# Patient Record
Sex: Male | Born: 1950 | Race: White | Hispanic: No | Marital: Married | Smoking: Never smoker
Health system: Southern US, Community
[De-identification: ages and names within clinical notes are randomized; demographics above are authoritative.]

## PROBLEM LIST (undated history)

## (undated) HISTORY — PX: BRAIN SURGERY: SHX531

---

## 2008-02-22 ENCOUNTER — Encounter (INDEPENDENT_AMBULATORY_CARE_PROVIDER_SITE_OTHER): Payer: Self-pay | Admitting: General Surgery

## 2008-02-22 ENCOUNTER — Ambulatory Visit (HOSPITAL_BASED_OUTPATIENT_CLINIC_OR_DEPARTMENT_OTHER): Admission: RE | Admit: 2008-02-22 | Discharge: 2008-02-22 | Payer: Self-pay | Admitting: General Surgery

## 2010-11-27 NOTE — Op Note (Signed)
Albert Moreno, Albert Moreno            ACCOUNT NO.:  [id number]   MEDICAL RECORD NO.:  [id number]          PATIENT TYPE:  AMB   LOCATION:  DSC                          FACILITY:  [facility]   PHYSICIAN:  Juanetta Gosling, MDDATE OF BIRTH:  [birth year]   DATE OF PROCEDURE:  02/22/2008  DATE OF DISCHARGE:                               OPERATIVE REPORT   PREOPERATIVE DIAGNOSIS:  Multiple masses, left arm, right arm, right  thigh, and abdominal wall.   POSTOPERATIVE DIAGNOSIS:  Multiple lipomas.   PROCEDURES:  Excisional biopsy of 2 right arm, 1 left arm, 1 abdominal  wall, and 1 right thigh mass.   SURGEON:  Troy Sine. Dwain Sarna, MD.   ASSISTANT:  None.   ANESTHESIA:  Local with monitored anesthesia care.   SPECIMENS:  All masses to pathology.   ESTIMATED BLOOD LOSS:  Minimal.   COMPLICATIONS:  None.   DRAINS:  None.   DISPOSITION:  PACU in stable condition.   INDICATION:  Albert Moreno is a 60 year old male who presents with a  several-year history of multiple lumps, which have been causing pain  especially while working.  These have been slowly increasing in size  over this time.  On exam, these appear to be consistent with lipomas and  I discussed excisional biopsy of all of these areas for him.   PROCEDURE IN DETAIL:  After informed consent was obtained, the patient  was taken to the operating room.  He was placed on a monitored  anesthesia care.  His left arm, right arm, abdominal wall, and thigh  were then prepped and draped with towels in the standard sterile  surgical fashion.  Lidocaine 1% with epinephrine was mixed 50:50 with  0.25% Marcaine without epinephrine.  This was infiltrated into all of  these areas.  The medial left arm mass was approached first.  A 1-cm  incision was made overlying the mass, which was then delivered into the  wound easily and removed with electrocautery.  This wound was closed  with 3-0 Vicryl deep dermal stitch and a 4-0 Monocryl in a  subcuticular  fashion.  The right arm mass was then approached.  A 1.5-cm incision was  made overlying this mass.  It was identified easily and delivered into  the wound and this mass had a blood vessel attached to it.  This was  ligated with a 3-0 Vicryl tie.  This wound was then again closed with 4-  0 Vicryl in the deep dermal suture and 4-0 Monocryl in a subcuticular  fashion.  The abdominal wall mass was then identified and 1.5-cm mass  delivered through the skin and excised using electrocautery.  A 4-0  Vicryl was used to close the dermis, 4-0 Monocryl was used to close the  skin in subcuticular fashion.  The right thigh mass was then approached.  A vertical 2-cm incision was made overlying the mass, which was then  delivered into the wound easily with no attachments and removed with  electrocautery.  This wound was also closed with 4-0 Vicryl deep dermal  suture and a 4-0 Monocryl in a subcuticular fashion.  The  other lateral  left arm mass was then approached with a 1.5-cm incision.  The lipoma  was then delivered into incision and removed with electrocautery.  This  was again closed with a 4-0 Vicryl deep dermal and 4-0 Monocryl  subcuticular.  Sterile dressings were placed on all of these after  placing Steri-Strips.  He tolerated this well under exclusively local  anesthesia and was transferred to PACU in stable condition.      Juanetta Gosling, MD  Electronically Signed     MCW/MEDQ  D:  02/22/2008  T:  02/23/2008  Job:  [id number]

## 2011-04-12 LAB — DIFFERENTIAL
Basophils Absolute: 0
Basophils Relative: 1
Eosinophils Absolute: 0.2
Eosinophils Relative: 3
Monocytes Absolute: 0.4
Neutro Abs: 3.5

## 2011-04-12 LAB — BASIC METABOLIC PANEL
CO2: 26
Calcium: 9.3
Chloride: 106
Glucose, Bld: 150 — ABNORMAL HIGH
Sodium: 139

## 2011-04-12 LAB — CBC
HCT: 39.5
Hemoglobin: 13.6
MCHC: 34.4
MCV: 88.9
RDW: 12.7

## 2013-02-15 ENCOUNTER — Other Ambulatory Visit: Payer: Self-pay | Admitting: Gastroenterology

## 2014-05-18 ENCOUNTER — Other Ambulatory Visit: Payer: Self-pay | Admitting: Dermatology

## 2017-01-08 ENCOUNTER — Other Ambulatory Visit: Payer: Self-pay | Admitting: Family Medicine

## 2017-01-08 DIAGNOSIS — Z79899 Other long term (current) drug therapy: Secondary | ICD-10-CM | POA: Diagnosis not present

## 2017-01-08 DIAGNOSIS — Z87891 Personal history of nicotine dependence: Secondary | ICD-10-CM

## 2017-01-08 DIAGNOSIS — E7800 Pure hypercholesterolemia, unspecified: Secondary | ICD-10-CM | POA: Diagnosis not present

## 2017-01-08 DIAGNOSIS — H9202 Otalgia, left ear: Secondary | ICD-10-CM | POA: Diagnosis not present

## 2017-01-08 DIAGNOSIS — Z0000 Encounter for general adult medical examination without abnormal findings: Secondary | ICD-10-CM | POA: Diagnosis not present

## 2017-01-24 ENCOUNTER — Ambulatory Visit
Admission: RE | Admit: 2017-01-24 | Discharge: 2017-01-24 | Disposition: A | Discharge status: Home / Self Care | Payer: Medicare Other | Source: Ambulatory Visit | Attending: Family Medicine | Admitting: Family Medicine

## 2017-01-24 DIAGNOSIS — Z87891 Personal history of nicotine dependence: Secondary | ICD-10-CM | POA: Diagnosis not present

## 2017-01-24 DIAGNOSIS — Z136 Encounter for screening for cardiovascular disorders: Secondary | ICD-10-CM | POA: Diagnosis not present

## 2017-02-14 DIAGNOSIS — H9202 Otalgia, left ear: Secondary | ICD-10-CM | POA: Diagnosis not present

## 2017-02-14 DIAGNOSIS — H9193 Unspecified hearing loss, bilateral: Secondary | ICD-10-CM | POA: Diagnosis not present

## 2017-04-04 ENCOUNTER — Other Ambulatory Visit: Payer: Self-pay | Admitting: Otolaryngology

## 2017-04-04 DIAGNOSIS — R5381 Other malaise: Secondary | ICD-10-CM

## 2017-04-21 ENCOUNTER — Other Ambulatory Visit: Payer: Self-pay | Admitting: Otolaryngology

## 2017-04-21 DIAGNOSIS — R51 Headache: Principal | ICD-10-CM

## 2017-04-21 DIAGNOSIS — R519 Headache, unspecified: Secondary | ICD-10-CM

## 2017-04-21 DIAGNOSIS — G8929 Other chronic pain: Secondary | ICD-10-CM

## 2017-05-02 ENCOUNTER — Ambulatory Visit
Admission: RE | Admit: 2017-05-02 | Discharge: 2017-05-02 | Disposition: A | Discharge status: Home / Self Care | Payer: Medicare Other | Source: Ambulatory Visit | Attending: Otolaryngology | Admitting: Otolaryngology

## 2017-05-02 ENCOUNTER — Encounter: Payer: Self-pay | Admitting: Otolaryngology

## 2017-05-02 DIAGNOSIS — R5381 Other malaise: Secondary | ICD-10-CM

## 2017-05-02 DIAGNOSIS — M542 Cervicalgia: Secondary | ICD-10-CM | POA: Diagnosis not present

## 2017-05-02 DIAGNOSIS — R51 Headache: Secondary | ICD-10-CM | POA: Diagnosis not present

## 2017-05-02 DIAGNOSIS — G8929 Other chronic pain: Secondary | ICD-10-CM

## 2017-05-02 MED ORDER — IOPAMIDOL (ISOVUE-300) INJECTION 61%: 75.0000 mL | Freq: Once | INTRAVENOUS | Status: DC | PRN

## 2017-05-06 DIAGNOSIS — D329 Benign neoplasm of meninges, unspecified: Secondary | ICD-10-CM | POA: Diagnosis not present

## 2017-05-07 ENCOUNTER — Other Ambulatory Visit: Payer: Self-pay | Admitting: Neurosurgery

## 2017-05-07 DIAGNOSIS — D329 Benign neoplasm of meninges, unspecified: Secondary | ICD-10-CM

## 2017-05-14 ENCOUNTER — Ambulatory Visit
Admission: RE | Admit: 2017-05-14 | Discharge: 2017-05-14 | Disposition: A | Discharge status: Home / Self Care | Payer: Medicare Other | Source: Ambulatory Visit | Attending: Neurosurgery | Admitting: Neurosurgery

## 2017-05-14 DIAGNOSIS — D329 Benign neoplasm of meninges, unspecified: Secondary | ICD-10-CM

## 2017-05-14 DIAGNOSIS — D320 Benign neoplasm of cerebral meninges: Secondary | ICD-10-CM | POA: Diagnosis not present

## 2017-05-14 MED ORDER — GADOBENATE DIMEGLUMINE 529 MG/ML IV SOLN
17.0000 mL | Freq: Once | INTRAVENOUS | Status: AC | PRN
  Administered 2017-05-14: 17 mL via INTRAVENOUS

## 2017-05-19 ENCOUNTER — Other Ambulatory Visit: Payer: Medicare Other

## 2017-05-20 DIAGNOSIS — R030 Elevated blood-pressure reading, without diagnosis of hypertension: Secondary | ICD-10-CM | POA: Diagnosis not present

## 2017-05-20 DIAGNOSIS — D329 Benign neoplasm of meninges, unspecified: Secondary | ICD-10-CM | POA: Diagnosis not present

## 2017-05-20 DIAGNOSIS — Z6827 Body mass index (BMI) 27.0-27.9, adult: Secondary | ICD-10-CM | POA: Diagnosis not present

## 2017-05-27 DIAGNOSIS — D329 Benign neoplasm of meninges, unspecified: Secondary | ICD-10-CM | POA: Diagnosis not present

## 2017-05-27 DIAGNOSIS — R399 Unspecified symptoms and signs involving the genitourinary system: Secondary | ICD-10-CM | POA: Diagnosis not present

## 2017-05-27 DIAGNOSIS — R791 Abnormal coagulation profile: Secondary | ICD-10-CM | POA: Diagnosis not present

## 2017-06-02 DIAGNOSIS — D329 Benign neoplasm of meninges, unspecified: Secondary | ICD-10-CM | POA: Diagnosis not present

## 2017-06-02 DIAGNOSIS — D496 Neoplasm of unspecified behavior of brain: Secondary | ICD-10-CM | POA: Diagnosis not present

## 2017-06-02 DIAGNOSIS — Z01818 Encounter for other preprocedural examination: Secondary | ICD-10-CM | POA: Diagnosis not present

## 2017-06-02 DIAGNOSIS — E785 Hyperlipidemia, unspecified: Secondary | ICD-10-CM | POA: Diagnosis present

## 2017-06-02 DIAGNOSIS — D320 Benign neoplasm of cerebral meninges: Secondary | ICD-10-CM | POA: Diagnosis present

## 2017-06-05 DIAGNOSIS — D320 Benign neoplasm of cerebral meninges: Secondary | ICD-10-CM | POA: Diagnosis present

## 2017-06-05 DIAGNOSIS — E785 Hyperlipidemia, unspecified: Secondary | ICD-10-CM | POA: Diagnosis present

## 2017-06-05 DIAGNOSIS — D496 Neoplasm of unspecified behavior of brain: Secondary | ICD-10-CM | POA: Diagnosis not present

## 2017-06-05 DIAGNOSIS — Z9889 Other specified postprocedural states: Secondary | ICD-10-CM | POA: Diagnosis not present

## 2017-06-05 DIAGNOSIS — R233 Spontaneous ecchymoses: Secondary | ICD-10-CM | POA: Diagnosis not present

## 2017-06-05 DIAGNOSIS — D329 Benign neoplasm of meninges, unspecified: Secondary | ICD-10-CM | POA: Diagnosis not present

## 2017-06-05 DIAGNOSIS — R569 Unspecified convulsions: Secondary | ICD-10-CM | POA: Diagnosis not present

## 2017-07-11 ENCOUNTER — Encounter (HOSPITAL_COMMUNITY): Payer: Self-pay | Admitting: Emergency Medicine

## 2017-07-11 ENCOUNTER — Other Ambulatory Visit: Payer: Self-pay

## 2017-07-11 ENCOUNTER — Inpatient Hospital Stay (HOSPITAL_COMMUNITY)
Admission: EM | Admit: 2017-07-11 | Discharge: 2017-07-14 | DRG: 176 | Disposition: A | Discharge status: Home / Self Care | Payer: Medicare Other | Attending: Internal Medicine | Admitting: Internal Medicine

## 2017-07-11 ENCOUNTER — Emergency Department (HOSPITAL_COMMUNITY): Payer: Medicare Other

## 2017-07-11 DIAGNOSIS — I82413 Acute embolism and thrombosis of femoral vein, bilateral: Secondary | ICD-10-CM | POA: Diagnosis present

## 2017-07-11 DIAGNOSIS — Z9889 Other specified postprocedural states: Secondary | ICD-10-CM

## 2017-07-11 DIAGNOSIS — D329 Benign neoplasm of meninges, unspecified: Secondary | ICD-10-CM

## 2017-07-11 DIAGNOSIS — D649 Anemia, unspecified: Secondary | ICD-10-CM | POA: Diagnosis present

## 2017-07-11 DIAGNOSIS — I82433 Acute embolism and thrombosis of popliteal vein, bilateral: Secondary | ICD-10-CM | POA: Diagnosis present

## 2017-07-11 DIAGNOSIS — R079 Chest pain, unspecified: Secondary | ICD-10-CM | POA: Diagnosis not present

## 2017-07-11 DIAGNOSIS — Z86011 Personal history of benign neoplasm of the brain: Secondary | ICD-10-CM

## 2017-07-11 DIAGNOSIS — R000 Tachycardia, unspecified: Secondary | ICD-10-CM | POA: Diagnosis present

## 2017-07-11 DIAGNOSIS — E785 Hyperlipidemia, unspecified: Secondary | ICD-10-CM

## 2017-07-11 DIAGNOSIS — I2609 Other pulmonary embolism with acute cor pulmonale: Secondary | ICD-10-CM | POA: Diagnosis not present

## 2017-07-11 DIAGNOSIS — R55 Syncope and collapse: Secondary | ICD-10-CM | POA: Diagnosis not present

## 2017-07-11 DIAGNOSIS — I248 Other forms of acute ischemic heart disease: Secondary | ICD-10-CM | POA: Diagnosis present

## 2017-07-11 DIAGNOSIS — I2692 Saddle embolus of pulmonary artery without acute cor pulmonale: Secondary | ICD-10-CM | POA: Diagnosis not present

## 2017-07-11 DIAGNOSIS — I2699 Other pulmonary embolism without acute cor pulmonale: Secondary | ICD-10-CM | POA: Diagnosis not present

## 2017-07-11 DIAGNOSIS — Z79899 Other long term (current) drug therapy: Secondary | ICD-10-CM

## 2017-07-11 LAB — CBC WITH DIFFERENTIAL/PLATELET
BASOS ABS: 0 10*3/uL (ref 0.0–0.1)
BASOS PCT: 1 %
EOS PCT: 2 %
Eosinophils Absolute: 0.1 10*3/uL (ref 0.0–0.7)
HEMATOCRIT: 33.6 % — AB (ref 39.0–52.0)
Hemoglobin: 11 g/dL — ABNORMAL LOW (ref 13.0–17.0)
LYMPHS PCT: 14 %
Lymphs Abs: 1.2 10*3/uL (ref 0.7–4.0)
MCH: 27.9 pg (ref 26.0–34.0)
MCHC: 32.7 g/dL (ref 30.0–36.0)
MCV: 85.3 fL (ref 78.0–100.0)
MONO ABS: 0.5 10*3/uL (ref 0.1–1.0)
MONOS PCT: 6 %
Neutro Abs: 6.6 10*3/uL (ref 1.7–7.7)
Neutrophils Relative %: 77 %
PLATELETS: 271 10*3/uL (ref 150–400)
RBC: 3.94 MIL/uL — ABNORMAL LOW (ref 4.22–5.81)
RDW: 14.1 % (ref 11.5–15.5)
WBC: 8.5 10*3/uL (ref 4.0–10.5)

## 2017-07-11 LAB — COMPREHENSIVE METABOLIC PANEL
ALT: 29 U/L (ref 17–63)
ANION GAP: 11 (ref 5–15)
AST: 30 U/L (ref 15–41)
Albumin: 3.5 g/dL (ref 3.5–5.0)
Alkaline Phosphatase: 86 U/L (ref 38–126)
BUN: 11 mg/dL (ref 6–20)
CHLORIDE: 105 mmol/L (ref 101–111)
CO2: 23 mmol/L (ref 22–32)
Calcium: 9.2 mg/dL (ref 8.9–10.3)
Creatinine, Ser: 0.87 mg/dL (ref 0.61–1.24)
GFR calc non Af Amer: >60 mL/min (ref >60)
Glucose, Bld: 120 mg/dL — ABNORMAL HIGH (ref 65–99)
POTASSIUM: 3.8 mmol/L (ref 3.5–5.1)
Sodium: 139 mmol/L (ref 135–145)
TOTAL PROTEIN: 6.4 g/dL — AB (ref 6.5–8.1)
Total Bilirubin: 0.9 mg/dL (ref 0.3–1.2)

## 2017-07-11 LAB — URINALYSIS, ROUTINE W REFLEX MICROSCOPIC
Bilirubin Urine: NEGATIVE
Glucose, UA: NEGATIVE mg/dL
HGB URINE DIPSTICK: NEGATIVE
Ketones, ur: 5 mg/dL — AB
LEUKOCYTES UA: NEGATIVE
NITRITE: NEGATIVE
Protein, ur: NEGATIVE mg/dL
pH: 7 (ref 5.0–8.0)

## 2017-07-11 LAB — HEPARIN LEVEL (UNFRACTIONATED)
Heparin Unfractionated: 0.65 IU/mL (ref 0.30–0.70)
Heparin Unfractionated: 0.43 IU/mL (ref 0.30–0.70)

## 2017-07-11 LAB — TROPONIN I
TROPONIN I: 0.33 ng/mL — AB (ref <0.03)
Troponin I: 0.66 ng/mL (ref <0.03)
Troponin I: 0.75 ng/mL (ref <0.03)

## 2017-07-11 MED ORDER — SODIUM CHLORIDE 0.9 % IV BOLUS (SEPSIS)
1000.0000 mL | Freq: Once | INTRAVENOUS | Status: AC
  Administered 2017-07-11: 1000 mL via INTRAVENOUS

## 2017-07-11 MED ORDER — HEPARIN BOLUS VIA INFUSION
4000.0000 [IU] | Freq: Once | INTRAVENOUS | Status: AC
  Administered 2017-07-11: 4000 [IU] via INTRAVENOUS
  Filled 2017-07-11: qty 4000

## 2017-07-11 MED ORDER — NOREPINEPHRINE BITARTRATE 1 MG/ML IV SOLN
50.0000 ug/min | INTRAVENOUS | Status: DC
  Filled 2017-07-11: qty 4

## 2017-07-11 MED ORDER — ACETAMINOPHEN 325 MG PO TABS: 650.0000 mg | ORAL_TABLET | Freq: Four times a day (QID) | ORAL | Status: DC | PRN

## 2017-07-11 MED ORDER — HEPARIN (PORCINE) IN NACL 100-0.45 UNIT/ML-% IJ SOLN
1400.0000 [IU]/h | INTRAMUSCULAR | Status: DC
  Administered 2017-07-11 – 2017-07-13 (×3): 1300 [IU]/h via INTRAVENOUS
  Filled 2017-07-11 (×4): qty 250

## 2017-07-11 MED ORDER — METOPROLOL TARTRATE 5 MG/5ML IV SOLN: 2.5000 mg | INTRAVENOUS | Status: DC | PRN

## 2017-07-11 MED ORDER — ASPIRIN 81 MG PO CHEW
324.0000 mg | CHEWABLE_TABLET | Freq: Once | ORAL | Status: AC
  Administered 2017-07-11: 324 mg via ORAL
  Filled 2017-07-11: qty 4

## 2017-07-11 MED ORDER — IOPAMIDOL (ISOVUE-370) INJECTION 76%
INTRAVENOUS | Status: AC
  Administered 2017-07-11: 100 mL
  Filled 2017-07-11: qty 100

## 2017-07-11 NOTE — Progress Notes (Signed)
CRITICAL VALUE STICKER  CRITICAL VALUE: Troponin 0.75  RECEIVER (on-site recipient of call):   DATE & TIME NOTIFIED: Resulted 07/11/17 - 23:40  MESSENGER (representative from lab): No call received  MD NOTIFIED: X. Blount  TIME OF NOTIFICATION: 07/11/17 23:45  RESPONSE: Text Paged.

## 2017-07-11 NOTE — ED Triage Notes (Signed)
Pt here from home with c/o sob and near syncopal after walking top his mailbox , pt did not hit his head , no chest pain , pt is 5 weeks post op brain surgery

## 2017-07-11 NOTE — Progress Notes (Signed)
Patient arrived to nursing unit alert and oriented x 4, No distress. Patient place on bedside and continuous pulse ox. Patient oriented to the room and unit protocol. Wife at bedside and updated per patient request.

## 2017-07-11 NOTE — ED Notes (Signed)
Pt given cup of ice water to drink  

## 2017-07-11 NOTE — Progress Notes (Signed)
ANTICOAGULATION CONSULT NOTE  Pharmacy Consult for Heparin  Indication: pulmonary embolus  No Known Allergies  Patient Measurements: Height: 5\' 9"  (175.3 cm) Weight: 176 lb 9.4 oz (80.1 kg) IBW/kg (Calculated) : 70.7 Heparin Dosing Weight: 79 kg  Vital Signs: Temp: 98.1 F (36.7 C) (12/28 1803) Temp Source: Oral (12/28 1803) BP: 118/88 (12/28 1742) Pulse Rate: 104 (12/28 1742)  Labs: Recent Labs    07/11/17 1033 07/11/17 1511 07/11/17 1953  HGB 11.0*  --   --   HCT 33.6*  --   --   PLT 271  --   --   HEPARINUNFRC  --  0.65 0.43  CREATININE 0.87  --   --   TROPONINI 0.33* 0.66*  --     Estimated Creatinine Clearance: 84.7 mL/min (by C-G formula based on SCr of 0.87 mg/dL).   Medical History: History reviewed. No pertinent past medical history.  Medications:  Medications Prior to Admission  Medication Sig Dispense Refill Last Dose  . acetaminophen (TYLENOL) 500 MG tablet Take 1,000 mg by mouth every 6 (six) hours as needed for mild pain or headache.   07/07/2017  . atorvastatin (LIPITOR) 10 MG tablet Take 10 mg by mouth daily.  0 07/11/2017 at Unknown time  . Multiple Vitamin (MULTI-VITAMINS) TABS Take 1 tablet by mouth daily.   07/10/2017 at Unknown time    Assessment: 40 YOM here with acute onset of dyspnea found to have a PE on CT. Pharmacy consulted to start IV heparin. Of note, patient is s/p a meningioma resection ~ 5 weeks ago. H/H 11/33.6. Plt wnl. He is not on any anticoagulation prior to admission  Initial heparin level was drawn too early, repeat is still therapeutic, but trending down.  Goal of Therapy:  Heparin level 0.3-0.7 units/ml Monitor platelets by anticoagulation protocol: Yes   Plan:  -Continue heparin at 1300 units/hr -Daily HL, CBC   Harvel Quale  07/11/2017 8:34 PM

## 2017-07-11 NOTE — ED Provider Notes (Signed)
Emergency Department Provider Note   I have reviewed the triage vital signs and the nursing notes.   HISTORY  Chief Complaint Near Syncope   HPI Albert Moreno is a 66 y.o. male without significant past medical history aside from a recent meningioma resection and due to presents the emergency department today with dyspnea on exertion and dizziness.  Patient states his normal state of health and return to the gym and has been doing okay without any problems but then today he was walking to the mailbox this morning when she does often and had acute onset of significant dyspnea with a mild left chest achiness and lightheadedness.  He was able to sit down take some deep breaths and symptoms resolved.  He proceeded to drive to the store to get coffee and he came back when he walked into the house he gets fairly dyspneic again and felt like he was going to pass out he sat down on the floor because he cannot go any further and his wife called ambulance who brought him here for further evaluation.  Rhythm strip with paramedics was normal without any evidence of ST elevation.  He has no history of smoking, hypertension, hyperlipidemia or coronary artery disease in his family at a young age.  At this time he is asymptomatic at rest.  No other associated modifying symptoms.  No recent fevers, cough or trauma.  Seems to be doing well from his surgery.  He does note that he had some left leg pain in the proximal thigh and mid thigh but he had associated this with a muscular pain secondary to working out.  He does not have that currently.   History reviewed. No pertinent past medical history.  Patient Active Problem List   Diagnosis Date Noted  . Pulmonary embolism ([place]) 07/11/2017    Past Surgical History:  Procedure Laterality Date  . BRAIN SURGERY        Allergies Patient has no known allergies.  History reviewed. No pertinent family history.  Social History Social History    Tobacco Use  . Smoking status: Never Smoker  . Smokeless tobacco: Never Used  Substance Use Topics  . Alcohol use: No    Frequency: Never  . Drug use: No    Review of Systems  All other systems negative except as documented in the HPI. All pertinent positives and negatives as reviewed in the HPI. ____________________________________________   PHYSICAL EXAM:  VITAL SIGNS: ED Triage Vitals  Enc Vitals Group     BP 07/11/17 1000 114/85     Pulse Rate 07/11/17 1000 (!) 117     Resp 07/11/17 1000 16     Temp 07/11/17 1000 98.2 F (36.8 C)     Temp Source 07/11/17 1000 Oral     SpO2 07/11/17 1000 93 %     Weight 07/11/17 1002 175 lb (79.4 kg)     Height 07/11/17 1002 5\' 9"  (1.753 m)     Head Circumference --      Peak Flow --      Pain Score --      Pain Loc --      Pain Edu? --      Excl. in [place]? --     Constitutional: Alert and oriented. Well appearing and in no acute distress. Eyes: Conjunctivae are normal. PERRL. EOMI. Head: Atraumatic. Nose: No congestion/rhinnorhea. Mouth/Throat: Mucous membranes are moist.  Oropharynx non-erythematous. Neck: No stridor.  No meningeal signs.   Cardiovascular: tachycardic  rate, regular rhythm. Good peripheral circulation. Grossly normal heart sounds.   Respiratory: Normal respiratory effort.  Resting pulse ox of 91-93, 90 with talking. No retractions. Lungs CTAB. Gastrointestinal: Soft and nontender. No distention.  Musculoskeletal: No lower extremity tenderness nor edema. No gross deformities of extremities. Neurologic:  Normal speech and language. No gross focal neurologic deficits are appreciated.  Skin:  Skin is warm, dry and intact. No rash noted.   ____________________________________________   LABS (all labs ordered are listed, but only abnormal results are displayed)  Labs Reviewed  TROPONIN I - Abnormal; Notable for the following components:      Result Value   Troponin I 0.33 (*)    All other components within  normal limits  CBC WITH DIFFERENTIAL/PLATELET - Abnormal; Notable for the following components:   RBC 3.94 (*)    Hemoglobin 11.0 (*)    HCT 33.6 (*)    All other components within normal limits  COMPREHENSIVE METABOLIC PANEL - Abnormal; Notable for the following components:   Glucose, Bld 120 (*)    Total Protein 6.4 (*)    All other components within normal limits  URINALYSIS, ROUTINE W REFLEX MICROSCOPIC - Abnormal; Notable for the following components:   Specific Gravity, Urine >1.046 (*)    Ketones, ur 5 (*)    All other components within normal limits  TROPONIN I - Abnormal; Notable for the following components:   Troponin I 0.66 (*)    All other components within normal limits  HEPARIN LEVEL (UNFRACTIONATED)  FACTOR 5 LEIDEN  TROPONIN I   ____________________________________________  EKG   EKG Interpretation  Date/Time:  Friday July 11 2017 16:29:18 EST Ventricular Rate:  109 PR Interval:    QRS Duration: 98 QT Interval:  346 QTC Calculation: 466 R Axis:   75 Text Interpretation:  Sinus tachycardia Borderline T abnormalities, inferior leads No acute changes Confirmed by Isla Pence [phone]) on 07/11/2017 4:31:37 PM       ____________________________________________  RADIOLOGY  Ct Head Wo Contrast  Result Date: 07/11/2017 CLINICAL DATA:  Near syncopal episode after walking 2 is mailbox, history of brain tumor excision EXAM: CT HEAD WITHOUT CONTRAST TECHNIQUE: Contiguous axial images were obtained from the base of the skull through the vertex without intravenous contrast. COMPARISON:  05/02/2017 maxillofacial CT FINDINGS: Brain: None Vascular: Interval LEFT temporal craniotomy. Normal ventricular morphology. No midline shift or mass effect. Edema within the LEFT hemisphere seen on the previous exam has markedly decreased since the prior study. Interval resection of large soft tissue mass at the anterior aspect of LEFT temporal fossa, by interval MR a likely  meningioma. 3 mm thick LEFT frontal and temporal subdural density is identified deep to the craniotomy site which may be related to recent surgery. No additional acute subdural collection. Posterior fossa unremarkable. No additional intracranial hemorrhage or evidence of acute infarction. Skull: LEFT temporoparietal craniotomy. Plate and screws at the LEFT zygoma. Sinuses/Orbits: Minimal fluid within a few a flocculus of the LEFT mastoid air cells. Remaining paranasal sinuses clear. Other: N/A IMPRESSION: Interval LEFT temporoparietal craniotomy and resection of a large mass at the anterior aspect of the LEFT middle cranial fossa. Marked decrease in edema within the LEFT temporal and parietal lobes with resolution of mass effect seen on previous exam. Subdural high density 3 mm thick at the LEFT temporal and frontal regions deep to the craniotomy, potentially related to prior surgery. No additional acute intracranial abnormalities identified. Critical Value/emergent results were called by telephone at the time  of interpretation on 07/11/2017 at 12:25 pm to Dr. Merrily Pew , who verbally acknowledged these results. Electronically Signed   By: Lavonia Dana M.D.   On: 07/11/2017 13:00   Ct Angio Chest Pe W And/or Wo Contrast  Result Date: 07/11/2017 CLINICAL DATA:  Shortness of breath with near syncope EXAM: CT ANGIOGRAPHY CHEST WITH CONTRAST TECHNIQUE: Multidetector CT imaging of the chest was performed using the standard protocol during bolus administration of intravenous contrast. Multiplanar CT image reconstructions and MIPs were obtained to evaluate the vascular anatomy. CONTRAST:  12mL ISOVUE-370 IOPAMIDOL (ISOVUE-370) INJECTION 76% COMPARISON:  None. FINDINGS: Cardiovascular: There is extensive pulmonary embolism arising from each distal main pulmonary artery with extension into multiple upper and lower lobe pulmonary arterial branches. The right ventricle to left ventricle diameter ratio is 1.4, a finding  indicative of right heart strain. There is no appreciable thoracic aortic aneurysm or dissection. Visualized great vessels appear normal. Pericardium does not appear thickened, and there is no pericardial effusion. Mediastinum/Nodes: Thyroid appears normal. There is no appreciable thoracic adenopathy. No esophageal lesions are evident. Lungs/Pleura: There is a calcified granuloma in the right lower lobe. There are areas of patchy atelectasis in the lower lobes. There is no edema or consolidation. No pleural effusion or pleural thickening evident. No pulmonary infarct demonstrable. Upper Abdomen: There is reflux of contrast into the inferior vena cava and hepatic veins, a finding indicative of increased right heart pressure. Visualized upper abdominal structures otherwise appear unremarkable. Musculoskeletal: There are no blastic or lytic bone lesions. Review of the MIP images confirms the above findings. IMPRESSION: 1. Extensive pulmonary embolus arising from each main pulmonary artery and extending into multiple upper and lower lobe pulmonary artery segments. Positive for acute PE with CT evidence of right heart strain (RV/LV Ratio = 1.4) consistent with at least submassive (intermediate risk) PE. The presence of right heart strain has been associated with an increased risk of morbidity and mortality. Please activate Code PE by paging [phone]. 2. Areas of atelectatic change in the lungs. No edema or consolidation. No pulmonary infarct evident. Small calcified granuloma right lower lobe. 3. Reflux of contrast into the inferior vena cava and hepatic veins, a finding indicative of increase in right heart pressure. 4.  No appreciable thoracic adenopathy. Critical Value/emergent results were called by telephone at the time of interpretation on 07/11/2017 at 12:16 pm to Dr. Merrily Pew , who verbally acknowledged these results. Electronically Signed   By: Lowella Grip III M.D.   On: 07/11/2017 12:17     ____________________________________________   PROCEDURES  Procedure(s) performed:   Procedures  CRITICAL CARE Performed by: Merrily Pew Total critical care time: 45 minutes Critical care time was exclusive of separately billable procedures and treating other patients. Critical care was necessary to treat or prevent imminent or life-threatening deterioration. Critical care was time spent personally by me on the following activities: development of treatment plan with patient and/or surrogate as well as nursing, discussions with consultants, evaluation of patient's response to treatment, examination of patient, obtaining history from patient or surrogate, ordering and performing treatments and interventions, ordering and review of laboratory studies, ordering and review of radiographic studies, pulse oximetry and re-evaluation of patient's condition.  ____________________________________________   INITIAL IMPRESSION / ASSESSMENT AND PLAN / ED COURSE  Needs eval for PE first and foremost. If this is normal, will walk with pulse ox and likely admit for acs rule out as well.   Troponin came back positive at that time I  discussed the risks and benefits of start heparin with a recent brain surgery and patient agreed to be started on anticoagulation as there benefits outweigh the risks.  CT scan end of showing evidence of a pulmonary embolus with right heart strain.  I discussed this with critical care medicine who evaluate the patient stated the patient could be admitted to medicine and stepdown unit.  I then discussed the case with the medicine attending Dr. Nevada Crane who agreed to admit the patient to stepdown for further management.  Pertinent labs & imaging results that were available during my care of the patient were reviewed by me and considered in my medical decision making (see chart for details).  ____________________________________________  FINAL CLINICAL IMPRESSION(S) / ED  DIAGNOSES  Final diagnoses:  Other acute pulmonary embolism without acute cor pulmonale (HCC)     MEDICATIONS GIVEN DURING THIS VISIT:  Medications  heparin ADULT infusion 100 units/mL (25000 units/238mL sodium chloride 0.45%) (1,300 Units/hr Intravenous Transfusing/Transfer 07/11/17 1636)  metoprolol tartrate (LOPRESSOR) injection 2.5 mg (not administered)  sodium chloride 0.9 % bolus 1,000 mL (0 mLs Intravenous Stopped 07/11/17 1255)  aspirin chewable tablet 324 mg (324 mg Oral Given 07/11/17 1135)  iopamidol (ISOVUE-370) 76 % injection (100 mLs  Contrast Given 07/11/17 1145)  heparin bolus via infusion 4,000 Units (4,000 Units Intravenous Bolus from Bag 07/11/17 1232)     NEW OUTPATIENT MEDICATIONS STARTED DURING THIS VISIT:  This SmartLink is deprecated. Use AVSMEDLIST instead to display the medication list for a patient.  Note:  This note was prepared with assistance of Dragon voice recognition software. Occasional wrong-word or sound-a-like substitutions may have occurred due to the inherent limitations of voice recognition software.   Merrily Pew, MD 07/11/17 [phone]

## 2017-07-11 NOTE — ED Notes (Signed)
Md made aware of critical trop.

## 2017-07-11 NOTE — ED Notes (Signed)
Patient transported to CT 

## 2017-07-11 NOTE — Progress Notes (Signed)
ANTICOAGULATION CONSULT NOTE - Initial Consult  Pharmacy Consult for Heparin  Indication: pulmonary embolus  No Known Allergies  Patient Measurements: Height: 5\' 9"  (175.3 cm) Weight: 175 lb (79.4 kg) IBW/kg (Calculated) : 70.7 Heparin Dosing Weight: 79 kg  Vital Signs: Temp: 98.2 F (36.8 C) (12/28 1000) Temp Source: Oral (12/28 1000) BP: 132/91 (12/28 1115) Pulse Rate: 110 (12/28 1115)  Labs: Recent Labs    07/11/17 1033  HGB 11.0*  HCT 33.6*  PLT 271  CREATININE 0.87  TROPONINI 0.33*    Estimated Creatinine Clearance: 84.7 mL/min (by C-G formula based on SCr of 0.87 mg/dL).   Medical History: History reviewed. No pertinent past medical history.  Medications:   (Not in a hospital admission)  Assessment: 89 YOM here with acute onset of dyspnea found to have a PE on CT. Pharmacy consulted to start IV heparin. Of note, patient is s/p a meningioma resection ~ 5 weeks ago. H/H 11/33.6. Plt wnl. He is not on any anticoagulation prior to admission  Goal of Therapy:  Heparin level 0.3-0.7 units/ml Monitor platelets by anticoagulation protocol: Yes   Plan:  -IV heparin 4000 units bolus, then heparin infusion at 1300 units/hr.  -F/u 6 hr HL -Monitor daily HL, CBC and s/s of bleeding  Albertina Parr, PharmD., BCPS Clinical Pharmacist Pager [phone]

## 2017-07-11 NOTE — H&P (Signed)
History and Physical  Albert Moreno HCW:[id number] DOB: 1951/03/30 DOA: 07/11/2017  Referring physician: Dr. Dolly Rias PCP: Lawerance Cruel, MD  Outpatient Specialists: Neurologist at [place] Patient coming from: Home  Chief Complaint: Shortness of breath  HPI: Albert Moreno is a 66 y.o. male with medical history significant for meningioma with resection 5 weeks ago at [facility], [place] who presented to ED [facility] with complaints of acute dyspnea. Patient reports that up until 3 days ago he had been exercising almost every day. This morning as he went up to his mail box he experienced acute and persistent dyspnea. Never experienced this previously. Also reports some tightness in his right LE which has been present for at least 3 days. No hemoptysis. No family hx of blood disorder.  ED Course: Troponin 0.33, CTA PE 07/11/17 Positive for acute submassive PE with CT evidence of right heart strain. ED physician reports he contacted CCM but they found it suitable for the patient to be admitted to stepdown unit.At the time of this evaluation, patient's symptoms had resolved. Not hypotensive and O2 saturation low 90's on RA.   Review of Systems: Review of systems as stated in the HPI are otherwise negative   History reviewed. No pertinent past medical history. Past Surgical History:  Procedure Laterality Date  . BRAIN SURGERY      Social History:  reports that  has never smoked. he has never used smokeless tobacco. He reports that he does not drink alcohol or use drugs.   No Known Allergies  History reviewed. No pertinent family history.  Patient denies family hx of heart disease or blood disorder.  Prior to Admission medications   Medication Sig Start Date End Date Taking? Authorizing Provider  acetaminophen (TYLENOL) 500 MG tablet Take 1,000 mg by mouth every 6 (six) hours as needed for mild pain or headache.   Yes [provider]  atorvastatin (LIPITOR) 10 MG tablet Take 10  mg by mouth daily. 05/20/17  Yes [provider]  Multiple Vitamin (MULTI-VITAMINS) TABS Take 1 tablet by mouth daily.   Yes [provider]    Physical Exam: BP 137/89   Pulse (!) 106   Temp 98.2 F (36.8 C) (Oral)   Resp 14   Ht 5\' 9"  (1.753 m)   Wt 79.4 kg (175 lb)   SpO2 94%   BMI 25.84 kg/m   General:  66 yo CM WD WN NAD A&O x 3 Eyes: pupils are round and reactive to light. Sclerae are anicteric  ENT: Mucous membranes dry no erythema or exudates Neck: No JVD or thyromegaly Cardiovascular: RRR no rubs or gallops  Respiratory: Mild rales at bases bilaterally  Abdomen: soft NT ND NBS X4  Skin: no noted rash or open lesions Musculoskeletal: Non focal. Moves all extremities freely. No noted lower extremity edema Psychiatric: Mood is appropriate for condition and setting  Neurologic: Alert and oriented x 3. Non focal.           Labs on Admission:  Basic Metabolic Panel: Recent Labs  Lab 07/11/17 1033  NA 139  K 3.8  CL 105  CO2 23  GLUCOSE 120*  BUN 11  CREATININE 0.87  CALCIUM 9.2   Liver Function Tests: Recent Labs  Lab 07/11/17 1033  AST 30  ALT 29  ALKPHOS 86  BILITOT 0.9  PROT 6.4*  ALBUMIN 3.5   No results for input(s): LIPASE, AMYLASE in the last 168 hours. No results for input(s): AMMONIA in the last 168  hours. CBC: Recent Labs  Lab 07/11/17 1033  WBC 8.5  NEUTROABS 6.6  HGB 11.0*  HCT 33.6*  MCV 85.3  PLT 271   Cardiac Enzymes: Recent Labs  Lab 07/11/17 1033  TROPONINI 0.33*    BNP (last 3 results) No results for input(s): BNP in the last 8760 hours.  ProBNP (last 3 results) No results for input(s): PROBNP in the last 8760 hours.  CBG: No results for input(s): GLUCAP in the last 168 hours.  Radiological Exams on Admission: Ct Head Wo Contrast  Result Date: 07/11/2017 CLINICAL DATA:  Near syncopal episode after walking 2 is mailbox, history of brain tumor excision EXAM: CT HEAD WITHOUT CONTRAST TECHNIQUE:  Contiguous axial images were obtained from the base of the skull through the vertex without intravenous contrast. COMPARISON:  05/02/2017 maxillofacial CT FINDINGS: Brain: None Vascular: Interval LEFT temporal craniotomy. Normal ventricular morphology. No midline shift or mass effect. Edema within the LEFT hemisphere seen on the previous exam has markedly decreased since the prior study. Interval resection of large soft tissue mass at the anterior aspect of LEFT temporal fossa, by interval MR a likely meningioma. 3 mm thick LEFT frontal and temporal subdural density is identified deep to the craniotomy site which may be related to recent surgery. No additional acute subdural collection. Posterior fossa unremarkable. No additional intracranial hemorrhage or evidence of acute infarction. Skull: LEFT temporoparietal craniotomy. Plate and screws at the LEFT zygoma. Sinuses/Orbits: Minimal fluid within a few a flocculus of the LEFT mastoid air cells. Remaining paranasal sinuses clear. Other: N/A IMPRESSION: Interval LEFT temporoparietal craniotomy and resection of a large mass at the anterior aspect of the LEFT middle cranial fossa. Marked decrease in edema within the LEFT temporal and parietal lobes with resolution of mass effect seen on previous exam. Subdural high density 3 mm thick at the LEFT temporal and frontal regions deep to the craniotomy, potentially related to prior surgery. No additional acute intracranial abnormalities identified. Critical Value/emergent results were called by telephone at the time of interpretation on 07/11/2017 at 12:25 pm to Dr. Merrily Pew , who verbally acknowledged these results. Electronically Signed   By: Lavonia Dana M.D.   On: 07/11/2017 13:00   Ct Angio Chest Pe W And/or Wo Contrast  Result Date: 07/11/2017 CLINICAL DATA:  Shortness of breath with near syncope EXAM: CT ANGIOGRAPHY CHEST WITH CONTRAST TECHNIQUE: Multidetector CT imaging of the chest was performed using the  standard protocol during bolus administration of intravenous contrast. Multiplanar CT image reconstructions and MIPs were obtained to evaluate the vascular anatomy. CONTRAST:  152mL ISOVUE-370 IOPAMIDOL (ISOVUE-370) INJECTION 76% COMPARISON:  None. FINDINGS: Cardiovascular: There is extensive pulmonary embolism arising from each distal main pulmonary artery with extension into multiple upper and lower lobe pulmonary arterial branches. The right ventricle to left ventricle diameter ratio is 1.4, a finding indicative of right heart strain. There is no appreciable thoracic aortic aneurysm or dissection. Visualized great vessels appear normal. Pericardium does not appear thickened, and there is no pericardial effusion. Mediastinum/Nodes: Thyroid appears normal. There is no appreciable thoracic adenopathy. No esophageal lesions are evident. Lungs/Pleura: There is a calcified granuloma in the right lower lobe. There are areas of patchy atelectasis in the lower lobes. There is no edema or consolidation. No pleural effusion or pleural thickening evident. No pulmonary infarct demonstrable. Upper Abdomen: There is reflux of contrast into the inferior vena cava and hepatic veins, a finding indicative of increased right heart pressure. Visualized upper abdominal structures otherwise appear unremarkable.  Musculoskeletal: There are no blastic or lytic bone lesions. Review of the MIP images confirms the above findings. IMPRESSION: 1. Extensive pulmonary embolus arising from each main pulmonary artery and extending into multiple upper and lower lobe pulmonary artery segments. Positive for acute PE with CT evidence of right heart strain (RV/LV Ratio = 1.4) consistent with at least submassive (intermediate risk) PE. The presence of right heart strain has been associated with an increased risk of morbidity and mortality. Please activate Code PE by paging [phone]. 2. Areas of atelectatic change in the lungs. No edema or  consolidation. No pulmonary infarct evident. Small calcified granuloma right lower lobe. 3. Reflux of contrast into the inferior vena cava and hepatic veins, a finding indicative of increase in right heart pressure. 4.  No appreciable thoracic adenopathy. Critical Value/emergent results were called by telephone at the time of interpretation on 07/11/2017 at 12:16 pm to Dr. Merrily Pew , who verbally acknowledged these results. Electronically Signed   By: Lowella Grip III M.D.   On: 07/11/2017 12:17    EKG: Independently reviewed. NO ST T elevation rate. Sinus tachycardia with rate of 114.   Assessment/Plan Present on Admission: . Pulmonary embolism (HCC)  Active Problems:   Pulmonary embolism (HCC)   Acute submassive PE with evidence of right heart strain on CT -Maintain MAP 65 or greater -Maintain O2 saturation 92% or greater -Heparin drip -B/l LE duplex U/S -2D Echo -close monitoring-call MD if abnormal/worsening vital signs  Elevated troponin -most likely 2/2 to acute pulmonary embolism -trend troponin q3H x2 -on heparin drip  Sinus tachycardia in the setting of PE -monitor closely -treat if HR>130-call MD -lopressor IV 2.5 mg q4 H prn for HR>130  Recent hemangioma s/p resection -Procedure done at [facility] 5 weeks ago by Dr. Romona Curls. [facility] Gobble neurosurgeon Dr. Shanda Bumps office, stated it is ok to fully anticoagulate.  Normocytic anemia -no prior records -hg 11.0, mcv 85 -continue to monitor  -CBC am  HLD -atorvastatin   DVT prophylaxis: heparin drip  Code Status: Full   Family Communication: with wife at bedside who is also medical POA. All questions answered to her satisfaction.  Disposition Plan: Will stay another midnight for IV anticoagulation and close monitoring.  Consults called: CCM by ED physician.  Admission status: Inpatient    Kayleen Memos MD [facility] Pager [phone]  If 7PM-7AM, please contact  night-coverage [email or web address] Password [email or web address]  07/11/2017, 2:57 PM

## 2017-07-12 ENCOUNTER — Inpatient Hospital Stay (HOSPITAL_COMMUNITY): Payer: Medicare Other

## 2017-07-12 DIAGNOSIS — I2699 Other pulmonary embolism without acute cor pulmonale: Secondary | ICD-10-CM

## 2017-07-12 LAB — TROPONIN I: Troponin I: 0.44 ng/mL (ref <0.03)

## 2017-07-12 LAB — CBC
HCT: 34.5 % — ABNORMAL LOW (ref 39.0–52.0)
HEMOGLOBIN: 11.5 g/dL — AB (ref 13.0–17.0)
MCH: 28.9 pg (ref 26.0–34.0)
MCHC: 33.3 g/dL (ref 30.0–36.0)
MCV: 86.7 fL (ref 78.0–100.0)
PLATELETS: 294 10*3/uL (ref 150–400)
RBC: 3.98 MIL/uL — AB (ref 4.22–5.81)
RDW: 14.4 % (ref 11.5–15.5)
WBC: 7.8 10*3/uL (ref 4.0–10.5)

## 2017-07-12 LAB — BASIC METABOLIC PANEL
Anion gap: 10 (ref 5–15)
BUN: 9 mg/dL (ref 6–20)
CHLORIDE: 105 mmol/L (ref 101–111)
CO2: 23 mmol/L (ref 22–32)
CREATININE: 0.87 mg/dL (ref 0.61–1.24)
Calcium: 9.1 mg/dL (ref 8.9–10.3)
Glucose, Bld: 100 mg/dL — ABNORMAL HIGH (ref 65–99)
Potassium: 3.9 mmol/L (ref 3.5–5.1)
SODIUM: 138 mmol/L (ref 135–145)

## 2017-07-12 LAB — ECHOCARDIOGRAM COMPLETE
HEIGHTINCHES: 69 in
WEIGHTICAEL: 2825.42 [oz_av]

## 2017-07-12 LAB — HEPARIN LEVEL (UNFRACTIONATED): HEPARIN UNFRACTIONATED: 0.38 [IU]/mL (ref 0.30–0.70)

## 2017-07-12 NOTE — Progress Notes (Signed)
VASCULAR LAB PRELIMINARY  PRELIMINARY  PRELIMINARY  PRELIMINARY  Bilateral lower extremity venous duplex completed.    Preliminary report:  There is non occlusive, acute, DVT noted in the bilateral distal femoral/popliteal veins and in the bilateral gastrocnemius veins.    Shakema Surita, RVT 07/12/2017, 3:49 PM

## 2017-07-12 NOTE — Progress Notes (Signed)
  Echocardiogram 2D Echocardiogram has been performed.  Tesslyn Baumert T Rocquel Askren 07/12/2017, 11:19 AM

## 2017-07-12 NOTE — Progress Notes (Signed)
PROGRESS NOTE    Albert Moreno  YTK:[id number] DOB: [birth year] DOA: 07/11/2017 PCP: Lawerance Cruel, MD  Brief Narrative:Albert Moreno is a 66 y.o. male with medical history significant for meningioma with resection 5 weeks ago at [facility], [place] who presented to ED [facility] with complaints of acute dyspnea, 12/28 morning -he went up to his mail box he experienced acute and persistent dyspnea. In ED Troponin 0.33, CTA PE 07/11/17 Positive for acute submassive PE with CT evidence of right heart strain.   Assessment & Plan:   Acute submassive PE with evidence of right heart strain on CT -Continue heparin drip for 48 hours -Follow-up of her extremity venous duplex -Check 2-D echocardiogram -Monitor clinically for any signs or symptoms of increased intracranial pressure or bleeding -Case was discussed with Dr. Shanda Bumps office yesterday on admission, stated it is ok to fully anticoagulate. -I called [facility] neurosurgery this morning, given weekend was unable to contact Dr. Tommi Rumps however spoke to neurosurgery coverage Dr.Alexa Bramall who also reiterated that since he is 5 weeks from meningioma resection, they would be okay with full dose anticoagulation of our choice  Elevated troponin -Due to demand ischemia from submassive pulmonary embolism -Check 2-D echocardiogram to evaluate on motion and right ventricle  Recent Meningioma s/p resection -Procedure done at [facility] 5 weeks ago by Dr. Romona Curls. Tommi Rumps -Contacted neurosurgeon Dr. Shanda Bumps office, stated it is ok to fully anticoagulate. -see above discussion  Normocytic anemia -stable, monitor  HLD -atorvastatin  DVT prophylaxis: On full dose heparin Code Status: Full code Family Communication: Wife at bedside Disposition Plan: Home in 2-3 days of stable  Consultants:   -Discussed PCCM on admission  -Discussed with neurosurgery on call at [facility]   Procedures:   Antimicrobials:    Subjective: -Feels better, hungry  and wants to eat, denies any chest pain this morning  Objective: Vitals:   07/12/17 0142 07/12/17 0500 07/12/17 0629 07/12/17 0945  BP: 107/78  101/77 126/84  Pulse: (!) 107  95 (!) 103  Resp: 20  20 14   Temp:  (!) 97.5 F (36.4 C) (!) 97.4 F (36.3 C) 98 F (36.7 C)  TempSrc:   Oral   SpO2: 93%  94% 95%  Weight:      Height:        Intake/Output Summary (Last 24 hours) at 07/12/2017 1148 Last data filed at 07/12/2017 1036 Gross per 24 hour  Intake 1427.63 ml  Output 1125 ml  Net 302.63 ml   Filed Weights   07/11/17 1002 07/11/17 1709  Weight: 79.4 kg (175 lb) 80.1 kg (176 lb 9.4 oz)    Examination:  General exam: Appears calm and comfortable  HEENT: Mild swelling of the lateral temporal area Respiratory system: Decreased breath sounds both bases Cardiovascular system: S1 & S2 heard, RRR. No JVD, murmurs, rubs, gallops  Gastrointestinal system: Abdomen is nondistended, soft and nontender.Normal bowel sounds heard. Central nervous system: Alert and oriented. No focal neurological deficits. Extremities: Symmetric 5 x 5 power. Skin: No rashes, lesions or ulcers Psychiatry: Judgement and insight appear normal. Mood & affect appropriate.     Data Reviewed:   CBC: Recent Labs  Lab 07/11/17 1033 07/12/17 0546  WBC 8.5 7.8  NEUTROABS 6.6  --   HGB 11.0* 11.5*  HCT 33.6* 34.5*  MCV 85.3 86.7  PLT 271 751   Basic Metabolic Panel: Recent Labs  Lab 07/11/17 1033 07/12/17 0546  NA 139 138  K 3.8 3.9  CL 105 105  CO2 23 23  GLUCOSE 120* 100*  BUN 11 9  CREATININE 0.87 0.87  CALCIUM 9.2 9.1   GFR: Estimated Creatinine Clearance: 84.7 mL/min (by C-G formula based on SCr of 0.87 mg/dL). Liver Function Tests: Recent Labs  Lab 07/11/17 1033  AST 30  ALT 29  ALKPHOS 86  BILITOT 0.9  PROT 6.4*  ALBUMIN 3.5   No results for input(s): LIPASE, AMYLASE in the last 168 hours. No results for input(s): AMMONIA in the last 168 hours. Coagulation Profile: No  results for input(s): INR, PROTIME in the last 168 hours. Cardiac Enzymes: Recent Labs  Lab 07/11/17 1033 07/11/17 1511 07/11/17 1953 07/12/17 0546  TROPONINI 0.33* 0.66* 0.75* 0.44*   BNP (last 3 results) No results for input(s): PROBNP in the last 8760 hours. HbA1C: No results for input(s): HGBA1C in the last 72 hours. CBG: No results for input(s): GLUCAP in the last 168 hours. Lipid Profile: No results for input(s): CHOL, HDL, LDLCALC, TRIG, CHOLHDL, LDLDIRECT in the last 72 hours. Thyroid Function Tests: No results for input(s): TSH, T4TOTAL, FREET4, T3FREE, THYROIDAB in the last 72 hours. Anemia Panel: No results for input(s): VITAMINB12, FOLATE, FERRITIN, TIBC, IRON, RETICCTPCT in the last 72 hours. Urine analysis:    Component Value Date/Time   COLORURINE YELLOW 07/11/2017 [place] 07/11/2017 1238   LABSPEC >1.046 (H) 07/11/2017 1238   PHURINE 7.0 07/11/2017 1238   GLUCOSEU NEGATIVE 07/11/2017 1238   HGBUR NEGATIVE 07/11/2017 1238   BILIRUBINUR NEGATIVE 07/11/2017 1238   KETONESUR 5 (A) 07/11/2017 1238   PROTEINUR NEGATIVE 07/11/2017 1238   NITRITE NEGATIVE 07/11/2017 1238   LEUKOCYTESUR NEGATIVE 07/11/2017 1238   Sepsis Labs: @LABRCNTIP (procalcitonin:4,lacticidven:4)  )No results found for this or any previous visit (from the past 240 hour(s)).       Radiology Studies: Ct Head Wo Contrast  Result Date: 07/11/2017 CLINICAL DATA:  Near syncopal episode after walking 2 is mailbox, history of brain tumor excision EXAM: CT HEAD WITHOUT CONTRAST TECHNIQUE: Contiguous axial images were obtained from the base of the skull through the vertex without intravenous contrast. COMPARISON:  05/02/2017 maxillofacial CT FINDINGS: Brain: None Vascular: Interval LEFT temporal craniotomy. Normal ventricular morphology. No midline shift or mass effect. Edema within the LEFT hemisphere seen on the previous exam has markedly decreased since the prior study. Interval  resection of large soft tissue mass at the anterior aspect of LEFT temporal fossa, by interval MR a likely meningioma. 3 mm thick LEFT frontal and temporal subdural density is identified deep to the craniotomy site which may be related to recent surgery. No additional acute subdural collection. Posterior fossa unremarkable. No additional intracranial hemorrhage or evidence of acute infarction. Skull: LEFT temporoparietal craniotomy. Plate and screws at the LEFT zygoma. Sinuses/Orbits: Minimal fluid within a few a flocculus of the LEFT mastoid air cells. Remaining paranasal sinuses clear. Other: N/A IMPRESSION: Interval LEFT temporoparietal craniotomy and resection of a large mass at the anterior aspect of the LEFT middle cranial fossa. Marked decrease in edema within the LEFT temporal and parietal lobes with resolution of mass effect seen on previous exam. Subdural high density 3 mm thick at the LEFT temporal and frontal regions deep to the craniotomy, potentially related to prior surgery. No additional acute intracranial abnormalities identified. Critical Value/emergent results were called by telephone at the time of interpretation on 07/11/2017 at 12:25 pm to Dr. Merrily Pew , who verbally acknowledged these results. Electronically Signed   By: Lavonia Dana M.D.   On:  07/11/2017 13:00   Ct Angio Chest Pe W And/or Wo Contrast  Result Date: 07/11/2017 CLINICAL DATA:  Shortness of breath with near syncope EXAM: CT ANGIOGRAPHY CHEST WITH CONTRAST TECHNIQUE: Multidetector CT imaging of the chest was performed using the standard protocol during bolus administration of intravenous contrast. Multiplanar CT image reconstructions and MIPs were obtained to evaluate the vascular anatomy. CONTRAST:  151mL ISOVUE-370 IOPAMIDOL (ISOVUE-370) INJECTION 76% COMPARISON:  None. FINDINGS: Cardiovascular: There is extensive pulmonary embolism arising from each distal main pulmonary artery with extension into multiple upper and  lower lobe pulmonary arterial branches. The right ventricle to left ventricle diameter ratio is 1.4, a finding indicative of right heart strain. There is no appreciable thoracic aortic aneurysm or dissection. Visualized great vessels appear normal. Pericardium does not appear thickened, and there is no pericardial effusion. Mediastinum/Nodes: Thyroid appears normal. There is no appreciable thoracic adenopathy. No esophageal lesions are evident. Lungs/Pleura: There is a calcified granuloma in the right lower lobe. There are areas of patchy atelectasis in the lower lobes. There is no edema or consolidation. No pleural effusion or pleural thickening evident. No pulmonary infarct demonstrable. Upper Abdomen: There is reflux of contrast into the inferior vena cava and hepatic veins, a finding indicative of increased right heart pressure. Visualized upper abdominal structures otherwise appear unremarkable. Musculoskeletal: There are no blastic or lytic bone lesions. Review of the MIP images confirms the above findings. IMPRESSION: 1. Extensive pulmonary embolus arising from each main pulmonary artery and extending into multiple upper and lower lobe pulmonary artery segments. Positive for acute PE with CT evidence of right heart strain (RV/LV Ratio = 1.4) consistent with at least submassive (intermediate risk) PE. The presence of right heart strain has been associated with an increased risk of morbidity and mortality. Please activate Code PE by paging [phone]. 2. Areas of atelectatic change in the lungs. No edema or consolidation. No pulmonary infarct evident. Small calcified granuloma right lower lobe. 3. Reflux of contrast into the inferior vena cava and hepatic veins, a finding indicative of increase in right heart pressure. 4.  No appreciable thoracic adenopathy. Critical Value/emergent results were called by telephone at the time of interpretation on 07/11/2017 at 12:16 pm to Dr. Merrily Pew , who verbally  acknowledged these results. Electronically Signed   By: Lowella Grip III M.D.   On: 07/11/2017 12:17        Scheduled Meds: Continuous Infusions: . heparin 1,300 Units/hr (07/12/17 0438)     LOS: 1 day    Time spent: 67min    Domenic Polite, MD [facility] Page via [email or web address], password [email or web address] After 7PM please contact night-coverage  07/12/2017, 11:48 AM

## 2017-07-12 NOTE — Progress Notes (Signed)
New orders received for repeat Troponin to be drawn once in am.

## 2017-07-12 NOTE — Progress Notes (Signed)
ANTICOAGULATION CONSULT NOTE  Pharmacy Consult for Heparin  Indication: pulmonary embolus  No Known Allergies  Patient Measurements: Height: 5\' 9"  (175.3 cm) Weight: 176 lb 9.4 oz (80.1 kg) IBW/kg (Calculated) : 70.7 Heparin Dosing Weight: 79 kg  Vital Signs: Temp: 97.4 F (36.3 C) (12/29 0629) Temp Source: Oral (12/29 0629) BP: 101/77 (12/29 0629) Pulse Rate: 95 (12/29 0629)  Labs: Recent Labs    07/11/17 1033 07/11/17 1511 07/11/17 1953 07/12/17 0546  HGB 11.0*  --   --  11.5*  HCT 33.6*  --   --  34.5*  PLT 271  --   --  294  HEPARINUNFRC  --  0.65 0.43 0.38  CREATININE 0.87  --   --  0.87  TROPONINI 0.33* 0.66* 0.75* 0.44*    Estimated Creatinine Clearance: 84.7 mL/min (by C-G formula based on SCr of 0.87 mg/dL).   Medical History: History reviewed. No pertinent past medical history.  Medications:  Medications Prior to Admission  Medication Sig Dispense Refill Last Dose  . acetaminophen (TYLENOL) 500 MG tablet Take 1,000 mg by mouth every 6 (six) hours as needed for mild pain or headache.   07/07/2017  . atorvastatin (LIPITOR) 10 MG tablet Take 10 mg by mouth daily.  0 07/11/2017 at Unknown time  . Multiple Vitamin (MULTI-VITAMINS) TABS Take 1 tablet by mouth daily.   07/10/2017 at Unknown time    Assessment: 21 YOM here with acute onset of dyspnea found to have a PE on CT. Pharmacy consulted to start IV heparin. Of note, patient is s/p a meningioma resection ~ 5 weeks ago. H/H slightly low but stable. Plt wnl. He is not on any anticoagulation prior to admission. Spoke with nurse about heparin drip this morning and she noted no concerns with the infusion or concern of bleeding at this time.   Goal of Therapy:  Heparin level 0.3-0.7 units/ml Monitor platelets by anticoagulation protocol: Yes   Plan:  -Continue heparin at 1300 units/hr -Monitor daily HL, CBC and s/s of bleeding  -continue to follow for anticoagulation plan for discharge   Jalene Mullet,  Pharm.D. PGY1 Pharmacy Resident 07/12/2017 7:57 AM Main Pharmacy: [phone]

## 2017-07-13 DIAGNOSIS — I2609 Other pulmonary embolism with acute cor pulmonale: Secondary | ICD-10-CM

## 2017-07-13 LAB — CBC
HCT: 34.3 % — ABNORMAL LOW (ref 39.0–52.0)
Hemoglobin: 11.3 g/dL — ABNORMAL LOW (ref 13.0–17.0)
MCH: 28.7 pg (ref 26.0–34.0)
MCHC: 32.9 g/dL (ref 30.0–36.0)
MCV: 87.1 fL (ref 78.0–100.0)
PLATELETS: 285 10*3/uL (ref 150–400)
RBC: 3.94 MIL/uL — ABNORMAL LOW (ref 4.22–5.81)
RDW: 14.5 % (ref 11.5–15.5)
WBC: 6.7 10*3/uL (ref 4.0–10.5)

## 2017-07-13 LAB — HEPARIN LEVEL (UNFRACTIONATED)
Heparin Unfractionated: 0.29 IU/mL — ABNORMAL LOW (ref 0.30–0.70)
Heparin Unfractionated: 0.44 IU/mL (ref 0.30–0.70)

## 2017-07-13 NOTE — Progress Notes (Signed)
PROGRESS NOTE    Albert Moreno  NTI:[id number] DOB: 25-Dec-1950 DOA: 07/11/2017 PCP: Albert Cruel, MD  Brief Narrative:Albert Moreno is a 66 y.o. male with medical history significant for meningioma with resection 5 weeks ago at [facility], [place] who presented to [facility] with complaints of acute dyspnea, 12/28 morning -he went up to his mail box he experienced acute and persistent dyspnea. In ED Troponin 0.33, CTA PE 07/11/17 Positive for acute submassive PE with CT evidence of right heart strain.   Assessment & Plan:   Acute submassive PE with evidence of right heart strain on CT -Continue heparin drip till tomorrow -Venous duplex with bilateral DVT -2-D echocardiogram with normal EF/motion with increased RV pressures and dilation -Clinically stable, case was discussed with Dr. Shanda Moreno office 12/28 on admission, stated it is ok to fully anticoagulate. -I called [facility] neurosurgery yesterday morning, given weekend was unable to contact Dr. Tommi Moreno however spoke to neurosurgery coverage Dr.Alexa Moreno who also reiterated that since he is 5 weeks from meningioma resection, they would be okay with full dose anticoagulation of our choice  Elevated troponin -Due to demand ischemia from submassive pulmonary embolism -2-D echocardiogram with increased RV pressures and normal ejection fraction and wall motion  Recent Meningioma s/p resection -Procedure done at [facility] 5 weeks ago by Dr. Romona Moreno. Albert Moreno neurosurgeon Dr. Shanda Moreno office, stated it is ok to fully anticoagulate. -see above discussion  Normocytic anemia -stable, monitor  HLD -atorvastatin  DVT prophylaxis: On full dose heparin Code Status: Full code Family Communication: Wife at bedside yesterday Disposition Plan: Home in 1-2 days if stable  Consultants:   -Discussed PCCM on admission  -Discussed with neurosurgery on call at [facility]   Procedures:   Antimicrobials:    Subjective: -Feels  okay, no complaints breathing improving, was able to get out of bed and do some exercises  Objective: Vitals:   07/13/17 0543 07/13/17 0740 07/13/17 0807 07/13/17 1231  BP: 107/76  122/81   Pulse: 83  92   Resp: 17  (!) 21   Temp:  97.8 F (36.6 C)  98.1 F (36.7 C)  TempSrc:      SpO2: 90%  94%   Weight:      Height:        Intake/Output Summary (Last 24 hours) at 07/13/2017 1245 Last data filed at 07/13/2017 1231 Gross per 24 hour  Intake 800.68 ml  Output 550 ml  Net 250.68 ml   Filed Weights   07/11/17 1002 07/11/17 1709  Weight: 79.4 kg (175 lb) 80.1 kg (176 lb 9.4 oz)    Examination:  Gen: Awake, Alert, Oriented X 3,  HEENT: PERRLA, Neck supple, no JVD Lungs: Decreased breath sounds at the bases, rest clear CVS: RRR,No Gallops,Rubs or new Murmurs Abd: soft, Non tender, non distended, BS present Extremities: No Cyanosis, Clubbing or edema Skin: no new rashes    Data Reviewed:   CBC: Recent Labs  Lab 07/11/17 1033 07/12/17 0546 07/13/17 0225  WBC 8.5 7.8 6.7  NEUTROABS 6.6  --   --   HGB 11.0* 11.5* 11.3*  HCT 33.6* 34.5* 34.3*  MCV 85.3 86.7 87.1  PLT 271 294 867   Basic Metabolic Panel: Recent Labs  Lab 07/11/17 1033 07/12/17 0546  NA 139 138  K 3.8 3.9  CL 105 105  CO2 23 23  GLUCOSE 120* 100*  BUN 11 9  CREATININE 0.87 0.87  CALCIUM 9.2 9.1   GFR: Estimated Creatinine Clearance: 84.7  mL/min (by C-G formula based on SCr of 0.87 mg/dL). Liver Function Tests: Recent Labs  Lab 07/11/17 1033  AST 30  ALT 29  ALKPHOS 86  BILITOT 0.9  PROT 6.4*  ALBUMIN 3.5   No results for input(s): LIPASE, AMYLASE in the last 168 hours. No results for input(s): AMMONIA in the last 168 hours. Coagulation Profile: No results for input(s): INR, PROTIME in the last 168 hours. Cardiac Enzymes: Recent Labs  Lab 07/11/17 1033 07/11/17 1511 07/11/17 1953 07/12/17 0546  TROPONINI 0.33* 0.66* 0.75* 0.44*   BNP (last 3 results) No results for  input(s): PROBNP in the last 8760 hours. HbA1C: No results for input(s): HGBA1C in the last 72 hours. CBG: No results for input(s): GLUCAP in the last 168 hours. Lipid Profile: No results for input(s): CHOL, HDL, LDLCALC, TRIG, CHOLHDL, LDLDIRECT in the last 72 hours. Thyroid Function Tests: No results for input(s): TSH, T4TOTAL, FREET4, T3FREE, THYROIDAB in the last 72 hours. Anemia Panel: No results for input(s): VITAMINB12, FOLATE, FERRITIN, TIBC, IRON, RETICCTPCT in the last 72 hours. Urine analysis:    Component Value Date/Time   COLORURINE YELLOW 07/11/2017 [place] 07/11/2017 1238   LABSPEC >1.046 (H) 07/11/2017 1238   PHURINE 7.0 07/11/2017 1238   GLUCOSEU NEGATIVE 07/11/2017 1238   HGBUR NEGATIVE 07/11/2017 1238   BILIRUBINUR NEGATIVE 07/11/2017 1238   KETONESUR 5 (A) 07/11/2017 1238   PROTEINUR NEGATIVE 07/11/2017 1238   NITRITE NEGATIVE 07/11/2017 1238   LEUKOCYTESUR NEGATIVE 07/11/2017 1238   Sepsis Labs: @LABRCNTIP (procalcitonin:4,lacticidven:4)  )No results found for this or any previous visit (from the past 240 hour(s)).       Radiology Studies: No results found.      Scheduled Meds: Continuous Infusions: . heparin 1,400 Units/hr (07/13/17 0419)     LOS: 2 days    Time spent: 40min    Albert Polite, MD [facility] Page via [email or web address], password [email or web address] After 7PM please contact night-coverage  07/13/2017, 12:45 PM

## 2017-07-13 NOTE — Progress Notes (Signed)
ANTICOAGULATION CONSULT NOTE - Follow Up Consult  Pharmacy Consult for heparin Indication: pulmonary embolus  Labs: Recent Labs    07/11/17 1033  07/11/17 1511 07/11/17 1953 07/12/17 0546 07/13/17 0225  HGB 11.0*  --   --   --  11.5* 11.3*  HCT 33.6*  --   --   --  34.5* 34.3*  PLT 271  --   --   --  294 285  HEPARINUNFRC  --    < > 0.65 0.43 0.38 0.29*  CREATININE 0.87  --   --   --  0.87  --   TROPONINI 0.33*  --  0.66* 0.75* 0.44*  --    < > = values in this interval not displayed.    Assessment: 66yo male now slightly subtherapeutic on heparin after three levels at goal though had been steadily trending down.  Goal of Therapy:  Heparin level 0.3-0.7 units/ml   Plan:  Will increase heparin gtt by 1-2 units/kg/hr to 1400 units/hr and check level in 6 hours.   Wynona Neat, PharmD, BCPS  07/13/2017,4:17 AM

## 2017-07-13 NOTE — Progress Notes (Signed)
ANTICOAGULATION CONSULT NOTE  Pharmacy Consult for Heparin  Indication: pulmonary embolus  No Known Allergies  Patient Measurements: Height: 5\' 9"  (175.3 cm) Weight: 176 lb 9.4 oz (80.1 kg) IBW/kg (Calculated) : 70.7 Heparin Dosing Weight: 79 kg  Vital Signs: Temp: 97.8 F (36.6 C) (12/30 0740) Temp Source: Oral (12/30 0410) BP: 122/81 (12/30 0807) Pulse Rate: 92 (12/30 0807)  Labs: Recent Labs    07/11/17 1033  07/11/17 1511 07/11/17 1953 07/12/17 0546 07/13/17 0225 07/13/17 0954  HGB 11.0*  --   --   --  11.5* 11.3*  --   HCT 33.6*  --   --   --  34.5* 34.3*  --   PLT 271  --   --   --  294 285  --   HEPARINUNFRC  --    < > 0.65 0.43 0.38 0.29* 0.44  CREATININE 0.87  --   --   --  0.87  --   --   TROPONINI 0.33*  --  0.66* 0.75* 0.44*  --   --    < > = values in this interval not displayed.    Estimated Creatinine Clearance: 84.7 mL/min (by C-G formula based on SCr of 0.87 mg/dL).   Medical History: History reviewed. No pertinent past medical history.  Medications:  Medications Prior to Admission  Medication Sig Dispense Refill Last Dose  . acetaminophen (TYLENOL) 500 MG tablet Take 1,000 mg by mouth every 6 (six) hours as needed for mild pain or headache.   07/07/2017  . atorvastatin (LIPITOR) 10 MG tablet Take 10 mg by mouth daily.  0 07/11/2017 at Unknown time  . Multiple Vitamin (MULTI-VITAMINS) TABS Take 1 tablet by mouth daily.   07/10/2017 at Unknown time    Assessment: 9 YOM here with acute onset of dyspnea found to have a PE on CT. Pharmacy consulted to start IV heparin. Of note, patient is s/p a meningioma resection ~ 5 weeks ago, per notes ok to fully anticoagulate. H/H slightly low but stable. Plt wnl. He is not on any anticoagulation prior to admission. No noted concerns with heparin infusion at this time. Heparin level this morning therapeutic at 0.44.   Goal of Therapy:  Heparin level 0.3-0.7 units/ml Monitor platelets by anticoagulation  protocol: Yes   Plan:  -Continue heparin at 1400 units/hr -Monitor daily HL, CBC and s/s of bleeding  -continue to follow for longer term anticoagulation plan  Jalene Mullet, Pharm.D. PGY1 Pharmacy Resident 07/13/2017 11:16 AM Main Pharmacy: [phone]

## 2017-07-14 LAB — CBC
HEMATOCRIT: 34.4 % — AB (ref 39.0–52.0)
HEMOGLOBIN: 11.4 g/dL — AB (ref 13.0–17.0)
MCH: 28.6 pg (ref 26.0–34.0)
MCHC: 33.1 g/dL (ref 30.0–36.0)
MCV: 86.4 fL (ref 78.0–100.0)
Platelets: 278 10*3/uL (ref 150–400)
RBC: 3.98 MIL/uL — ABNORMAL LOW (ref 4.22–5.81)
RDW: 14.3 % (ref 11.5–15.5)
WBC: 7.2 10*3/uL (ref 4.0–10.5)

## 2017-07-14 LAB — HEPARIN LEVEL (UNFRACTIONATED): Heparin Unfractionated: 0.46 IU/mL (ref 0.30–0.70)

## 2017-07-14 MED ORDER — APIXABAN 5 MG PO TABS
10.0000 mg | ORAL_TABLET | Freq: Two times a day (BID) | ORAL | Status: DC
  Administered 2017-07-14: 10 mg via ORAL
  Filled 2017-07-14: qty 2

## 2017-07-14 MED ORDER — APIXABAN 5 MG PO TABS: 10.0000 mg | ORAL_TABLET | Freq: Two times a day (BID) | ORAL | 0 refills | Status: AC

## 2017-07-14 NOTE — Progress Notes (Signed)
CM consulted for Eliquis 30 day free card. CM provided the patient the 30 day free card and went over providing it to his pharmacy. CM also submitted for benefits check for cost of Eliquis after the 30 days. Awaiting results.

## 2017-07-14 NOTE — Discharge Instructions (Signed)
Information on my medicine - ELIQUIS (apixaban)  This medication education was reviewed with me or my healthcare representative as part of my discharge preparation.  The pharmacist that spoke with me during my hospital stay was:  [facility], Margot Chimes, [facility]  Why was Eliquis prescribed for you? Eliquis was prescribed to treat blood clots that may have been found in the veins of your legs (deep vein thrombosis) or in your lungs (pulmonary embolism) and to reduce the risk of them occurring again.  What do You need to know about Eliquis ? The starting dose is 10 mg (two 5 mg tablets) taken TWICE daily for the FIRST SEVEN (7) DAYS, then on 07/21/17  the dose is reduced to ONE 5 mg tablet taken TWICE daily.  Eliquis may be taken with or without food.   Try to take the dose about the same time in the morning and in the evening. If you have difficulty swallowing the tablet whole please discuss with your pharmacist how to take the medication safely.  Take Eliquis exactly as prescribed and DO NOT stop taking Eliquis without talking to the doctor who prescribed the medication.  Stopping may increase your risk of developing a new blood clot.  Refill your prescription before you run out.  After discharge, you should have regular check-up appointments with your healthcare provider that is prescribing your Eliquis.    What do you do if you miss a dose? If a dose of ELIQUIS is not taken at the scheduled time, take it as soon as possible on the same day and twice-daily administration should be resumed. The dose should not be doubled to make up for a missed dose.  Important Safety Information A possible side effect of Eliquis is bleeding. You should call your healthcare provider right away if you experience any of the following: ? Bleeding from an injury or your nose that does not stop. ? Unusual colored urine (red or dark brown) or unusual colored stools (red or black). ? Unusual bruising for  unknown reasons. ? A serious fall or if you hit your head (even if there is no bleeding).  Some medicines may interact with Eliquis and might increase your risk of bleeding or clotting while on Eliquis. To help avoid this, consult your healthcare provider or pharmacist prior to using any new prescription or non-prescription medications, including herbals, vitamins, non-steroidal anti-inflammatory drugs (NSAIDs) and supplements.  This website has more information on Eliquis (apixaban): http://www.eliquis.com/eliquis/home

## 2017-07-14 NOTE — Progress Notes (Signed)
Albert Moreno to be D/C'd Home per MD order.  Discussed with the patient and all questions fully answered.  VSS, Skin clean, dry and intact without evidence of skin break down, no evidence of skin tears noted. IV catheter discontinued intact. Site without signs and symptoms of complications. Dressing and pressure applied.  An After Visit Summary was printed and given to the patient. Patient received prescription.  D/c education completed with patient/family including follow up instructions, medication list, d/c activities limitations if indicated, with other d/c instructions as indicated by MD - patient able to verbalize understanding, all questions fully answered.   Patient instructed to return to ED, call 911, or call MD for any changes in condition.   Patient escorted via [place], and D/C home via private auto.  [name] 07/14/2017 4:05 PM

## 2017-07-14 NOTE — Progress Notes (Addendum)
ANTICOAGULATION CONSULT NOTE  Pharmacy Consult for Heparin  Indication: pulmonary embolus  No Known Allergies  Patient Measurements: Height: 5\' 9"  (175.3 cm) Weight: 176 lb 9.4 oz (80.1 kg) IBW/kg (Calculated) : 70.7 Heparin Dosing Weight: 79 kg  Vital Signs: Temp: 98.8 F (37.1 C) (12/31 0514) Temp Source: Oral (12/31 0514) BP: 105/71 (12/31 0514) Pulse Rate: 78 (12/31 0514)  Labs: Recent Labs    07/11/17 1033  07/11/17 1511 07/11/17 1953 07/12/17 0546 07/13/17 0225 07/13/17 0954 07/14/17 0251  HGB 11.0*  --   --   --  11.5* 11.3*  --  11.4*  HCT 33.6*  --   --   --  34.5* 34.3*  --  34.4*  PLT 271  --   --   --  294 285  --  278  HEPARINUNFRC  --    < > 0.65 0.43 0.38 0.29* 0.44 0.46  CREATININE 0.87  --   --   --  0.87  --   --   --   TROPONINI 0.33*  --  0.66* 0.75* 0.44*  --   --   --    < > = values in this interval not displayed.    Estimated Creatinine Clearance: 84.7 mL/min (by C-G formula based on SCr of 0.87 mg/dL).   Medications:  . heparin 1,400 Units/hr (07/13/17 0419)    Assessment: Albert Moreno here with acute onset of dyspnea found to have a PE on CT. Pharmacy consulted to start IV heparin. Of note, patient is s/p a meningioma resection ~ 5 weeks ago, per notes ok to fully anticoagulate. H/H slightly low but stable. Plt wnl. He was not on any anticoagulation prior to admission. No noted concerns with heparin infusion at this time. Heparin level this morning therapeutic at 0.46.   Goal of Therapy:  Heparin level 0.3-0.7 units/ml Monitor platelets by anticoagulation protocol: Yes   Plan:  -Continue heparin drip at 1400 units/hr -Monitor daily heparin level, CBC  -Monitor for s/sx of bleeding -F/U oral anticoagulation plan   Renold Genta, PharmD, BCPS Clinical Pharmacist Phone for today - [place] - [phone] 07/14/2017 8:44 AM    Addendum: Geanie Cooley to transition to apixaban.   D/c heparin drip Apixaban 10 mg PO bid for 7  days then 5 mg PO bid Education prior to discharge  [facility], PharmD, BCPS 07/14/2017 10:30 AM

## 2017-07-14 NOTE — Care Management Note (Signed)
Case Management Note  Patient Details  Name: Albert Moreno MRN: [id number] Date of Birth: Nov 03, 1950  Subjective/Objective:   Pt admitted with PE. He is from home with his spouse.                 Action/Plan: Pt d/cing home with his spouse. CM did not receive benefits check for Eliquis prior to pts d/c. Pt to follow up with his pharmacy for the monthly cost of his Eliquis and his MD if cost is too high. CM went over this information with the patient.  Pts wife to provide transportation home.   Expected Discharge Date:  07/14/17               Expected Discharge Plan:  Home/Self Care  In-House Referral:     Discharge planning Services  CM Consult, Other - See comment(Eliquis card)  Post Acute Care Choice:    Choice offered to:     DME Arranged:    DME Agency:     HH Arranged:    HH Agency:     Status of Service:  Completed, signed off  If discussed at [employer] of Stay Meetings, dates discussed:    Additional Comments:  Pollie Friar, RN 07/14/2017, 2:37 PM

## 2017-07-17 LAB — FACTOR 5 LEIDEN

## 2017-07-22 DIAGNOSIS — D329 Benign neoplasm of meninges, unspecified: Secondary | ICD-10-CM | POA: Diagnosis not present

## 2017-07-23 NOTE — Discharge Summary (Signed)
Physician Discharge Summary  Albert Moreno SNK:[id number] DOB: 1950-10-22 DOA: 07/11/2017  PCP: Lawerance Cruel, MD  Admit date: 07/11/2017 Discharge date: 07/14/2018  Time spent: 45 minutes  Recommendations for Outpatient Follow-up:  1. PCP in 1 week 2.  Hematology in 1-70months to determine duration of anticoagulation  Discharge Diagnoses:  Active Problems:   Acute Pulmonary embolism ([place])   Acute DVT   Recent meningioma resection  Discharge Condition: stable  Diet recommendation: regular  Filed Weights   07/11/17 1002 07/11/17 1709  Weight: 79.4 kg (175 lb) 80.1 kg (176 lb 9.4 oz)    History of present illness:  Albert Popov Robinsonis a 67 y.o.malewith medical history significant formeningioma with resection 5 weeks ago at [facility], [place] who presented to ED [facility] with complaints ofacutedyspnea, 12/28 morning -he went up to his mail box he experiencedacute andpersistent dyspnea. In EDTroponin 0.33, CTA PE 12/28/18Positive for acutesubmassivePE with CT evidence of right heart strain.   Hospital Course:   Acute submassive PEwith evidence of right heart strain on CT -Treated with IV heparin drip for 48hours inpatient -Venous duplex with bilateral DVT -2-D echocardiogram with normal EF/motion with increased RV pressures and dilation -Clinically stable, case was discussed with Dr. Shanda Bumps office 12/28 on admission, stated it is ok to fully anticoagulate. -I called [facility] neurosurgery  yesterday and today and was able to contact Dr.Friedmans office who again reiterated that they are ok with full dose anticoagulation of our choice. -subsequently transitioned to Oral Eliquis  -most likely risk factor for DVT/PE is recent surgery and hence 71months anticoagulation may be enough but I have advised him to FU with Hematology to decide duration of anticoagulation  Elevated troponin -Due to demand ischemia from submassive pulmonary embolism -2-D echocardiogram with  increased RV pressures and normal ejection fraction and wall motion  Recent Meningioma s/p resection -Procedure done at [facility] weeks ago by Dr. Romona Curls. Friedman -Contactedneurosurgeon Dr. Shanda Bumps office, stated it is ok to fully anticoagulate. -see above discussion  Normocytic anemia -stable, monitor  HLD -atorvastatin     Consultations:  None , called and D/w Dr.Friedman's office at [facility]  Discharge Exam: Vitals:   07/14/17 0514 07/14/17 1415  BP: 105/71 112/66  Pulse: 78 94  Resp: 18   Temp: 98.8 F (37.1 C) 98.2 F (36.8 C)  SpO2: 95% 97%    General: AAOx3 Cardiovascular: S1S2/RRR Respiratory: CTAB  Discharge Instructions   Discharge Instructions    Diet - low sodium heart healthy   Complete by:  As directed    Increase activity slowly   Complete by:  As directed      Allergies as of 07/14/2017   No Known Allergies     Medication List    TAKE these medications   acetaminophen 500 MG tablet Commonly known as:  TYLENOL Take 1,000 mg by mouth every 6 (six) hours as needed for mild pain or headache.   apixaban 5 MG Tabs tablet Commonly known as:  ELIQUIS Take 2 tablets (10 mg total) by mouth 2 (two) times daily. Take 10mg  twice a day for 7days then take 5mg  twice a day thereafter Notes to patient:  Started 12/31 10 mg (2 tablets) twice a day for 7 days, then 5 mg  (1 tablet) Twice a day    atorvastatin 10 MG tablet Commonly known as:  LIPITOR Take 10 mg by mouth daily.   MULTI-VITAMINS Tabs Take 1 tablet by mouth daily.      No Known Allergies Follow-up Information  Lawerance Cruel, MD. Schedule an appointment as soon as possible for a visit in 1 week(s).   Specialty:  Family Medicine Why:  Dr.Ross can refer you to a hematologist to determine duration of Eliquis, for FU in 2-38months Contact information: [address] [place] [phone]            The results of significant diagnostics from this  hospitalization (including imaging, microbiology, ancillary and laboratory) are listed below for reference.    Significant Diagnostic Studies: Ct Head Wo Contrast  Result Date: 07/11/2017 CLINICAL DATA:  Near syncopal episode after walking 2 is mailbox, history of brain tumor excision EXAM: CT HEAD WITHOUT CONTRAST TECHNIQUE: Contiguous axial images were obtained from the base of the skull through the vertex without intravenous contrast. COMPARISON:  05/02/2017 maxillofacial CT FINDINGS: Brain: None Vascular: Interval LEFT temporal craniotomy. Normal ventricular morphology. No midline shift or mass effect. Edema within the LEFT hemisphere seen on the previous exam has markedly decreased since the prior study. Interval resection of large soft tissue mass at the anterior aspect of LEFT temporal fossa, by interval MR a likely meningioma. 3 mm thick LEFT frontal and temporal subdural density is identified deep to the craniotomy site which may be related to recent surgery. No additional acute subdural collection. Posterior fossa unremarkable. No additional intracranial hemorrhage or evidence of acute infarction. Skull: LEFT temporoparietal craniotomy. Plate and screws at the LEFT zygoma. Sinuses/Orbits: Minimal fluid within a few a flocculus of the LEFT mastoid air cells. Remaining paranasal sinuses clear. Other: N/A IMPRESSION: Interval LEFT temporoparietal craniotomy and resection of a large mass at the anterior aspect of the LEFT middle cranial fossa. Marked decrease in edema within the LEFT temporal and parietal lobes with resolution of mass effect seen on previous exam. Subdural high density 3 mm thick at the LEFT temporal and frontal regions deep to the craniotomy, potentially related to prior surgery. No additional acute intracranial abnormalities identified. Critical Value/emergent results were called by telephone at the time of interpretation on 07/11/2017 at 12:25 pm to Dr. Merrily Pew , who verbally  acknowledged these results. Electronically Signed   By: Lavonia Dana M.D.   On: 07/11/2017 13:00   Ct Angio Chest Pe W And/or Wo Contrast  Result Date: 07/11/2017 CLINICAL DATA:  Shortness of breath with near syncope EXAM: CT ANGIOGRAPHY CHEST WITH CONTRAST TECHNIQUE: Multidetector CT imaging of the chest was performed using the standard protocol during bolus administration of intravenous contrast. Multiplanar CT image reconstructions and MIPs were obtained to evaluate the vascular anatomy. CONTRAST:  121mL ISOVUE-370 IOPAMIDOL (ISOVUE-370) INJECTION 76% COMPARISON:  None. FINDINGS: Cardiovascular: There is extensive pulmonary embolism arising from each distal main pulmonary artery with extension into multiple upper and lower lobe pulmonary arterial branches. The right ventricle to left ventricle diameter ratio is 1.4, a finding indicative of right heart strain. There is no appreciable thoracic aortic aneurysm or dissection. Visualized great vessels appear normal. Pericardium does not appear thickened, and there is no pericardial effusion. Mediastinum/Nodes: Thyroid appears normal. There is no appreciable thoracic adenopathy. No esophageal lesions are evident. Lungs/Pleura: There is a calcified granuloma in the right lower lobe. There are areas of patchy atelectasis in the lower lobes. There is no edema or consolidation. No pleural effusion or pleural thickening evident. No pulmonary infarct demonstrable. Upper Abdomen: There is reflux of contrast into the inferior vena cava and hepatic veins, a finding indicative of increased right heart pressure. Visualized upper abdominal structures otherwise appear unremarkable. Musculoskeletal:  There are no blastic or lytic bone lesions. Review of the MIP images confirms the above findings. IMPRESSION: 1. Extensive pulmonary embolus arising from each main pulmonary artery and extending into multiple upper and lower lobe pulmonary artery segments. Positive for acute PE with  CT evidence of right heart strain (RV/LV Ratio = 1.4) consistent with at least submassive (intermediate risk) PE. The presence of right heart strain has been associated with an increased risk of morbidity and mortality. Please activate Code PE by paging [phone]. 2. Areas of atelectatic change in the lungs. No edema or consolidation. No pulmonary infarct evident. Small calcified granuloma right lower lobe. 3. Reflux of contrast into the inferior vena cava and hepatic veins, a finding indicative of increase in right heart pressure. 4.  No appreciable thoracic adenopathy. Critical Value/emergent results were called by telephone at the time of interpretation on 07/11/2017 at 12:16 pm to Dr. Merrily Pew , who verbally acknowledged these results. Electronically Signed   By: Lowella Grip III M.D.   On: 07/11/2017 12:17    Microbiology: No results found for this or any previous visit (from the past 240 hour(s)).   Labs: Basic Metabolic Panel: No results for input(s): NA, K, CL, CO2, GLUCOSE, BUN, CREATININE, CALCIUM, MG, PHOS in the last 168 hours. Liver Function Tests: No results for input(s): AST, ALT, ALKPHOS, BILITOT, PROT, ALBUMIN in the last 168 hours. No results for input(s): LIPASE, AMYLASE in the last 168 hours. No results for input(s): AMMONIA in the last 168 hours. CBC: No results for input(s): WBC, NEUTROABS, HGB, HCT, MCV, PLT in the last 168 hours. Cardiac Enzymes: No results for input(s): CKTOTAL, CKMB, CKMBINDEX, TROPONINI in the last 168 hours. BNP: BNP (last 3 results) No results for input(s): BNP in the last 8760 hours.  ProBNP (last 3 results) No results for input(s): PROBNP in the last 8760 hours.  CBG: No results for input(s): GLUCAP in the last 168 hours.     Signed:  Domenic Polite MD.  [facility] 07/23/2017, 3:39 PM

## 2017-07-25 DIAGNOSIS — I2699 Other pulmonary embolism without acute cor pulmonale: Secondary | ICD-10-CM | POA: Diagnosis not present

## 2017-07-25 DIAGNOSIS — I82409 Acute embolism and thrombosis of unspecified deep veins of unspecified lower extremity: Secondary | ICD-10-CM | POA: Diagnosis not present

## 2017-08-05 DIAGNOSIS — L821 Other seborrheic keratosis: Secondary | ICD-10-CM | POA: Diagnosis not present

## 2017-08-05 DIAGNOSIS — L570 Actinic keratosis: Secondary | ICD-10-CM | POA: Diagnosis not present

## 2017-08-05 DIAGNOSIS — Z23 Encounter for immunization: Secondary | ICD-10-CM | POA: Diagnosis not present

## 2017-08-05 DIAGNOSIS — Z808 Family history of malignant neoplasm of other organs or systems: Secondary | ICD-10-CM | POA: Diagnosis not present

## 2017-08-05 DIAGNOSIS — D1801 Hemangioma of skin and subcutaneous tissue: Secondary | ICD-10-CM | POA: Diagnosis not present

## 2017-08-05 DIAGNOSIS — D225 Melanocytic nevi of trunk: Secondary | ICD-10-CM | POA: Diagnosis not present

## 2017-08-05 DIAGNOSIS — Z86018 Personal history of other benign neoplasm: Secondary | ICD-10-CM | POA: Diagnosis not present

## 2018-01-12 DIAGNOSIS — Z86711 Personal history of pulmonary embolism: Secondary | ICD-10-CM | POA: Diagnosis not present

## 2018-01-19 ENCOUNTER — Other Ambulatory Visit: Payer: Self-pay | Admitting: Family Medicine

## 2018-01-19 DIAGNOSIS — Z86711 Personal history of pulmonary embolism: Secondary | ICD-10-CM

## 2018-01-21 ENCOUNTER — Ambulatory Visit
Admission: RE | Admit: 2018-01-21 | Discharge: 2018-01-21 | Disposition: A | Discharge status: Home / Self Care | Payer: Medicare Other | Source: Ambulatory Visit | Attending: Family Medicine | Admitting: Family Medicine

## 2018-01-21 DIAGNOSIS — Z86711 Personal history of pulmonary embolism: Secondary | ICD-10-CM | POA: Diagnosis not present

## 2018-02-17 DIAGNOSIS — Z79899 Other long term (current) drug therapy: Secondary | ICD-10-CM | POA: Diagnosis not present

## 2018-02-17 DIAGNOSIS — Z1389 Encounter for screening for other disorder: Secondary | ICD-10-CM | POA: Diagnosis not present

## 2018-02-17 DIAGNOSIS — M79669 Pain in unspecified lower leg: Secondary | ICD-10-CM | POA: Diagnosis not present

## 2018-02-17 DIAGNOSIS — Z86711 Personal history of pulmonary embolism: Secondary | ICD-10-CM | POA: Diagnosis not present

## 2018-02-17 DIAGNOSIS — Z0000 Encounter for general adult medical examination without abnormal findings: Secondary | ICD-10-CM | POA: Diagnosis not present

## 2018-02-17 DIAGNOSIS — R972 Elevated prostate specific antigen [PSA]: Secondary | ICD-10-CM | POA: Diagnosis not present

## 2018-02-17 DIAGNOSIS — Z125 Encounter for screening for malignant neoplasm of prostate: Secondary | ICD-10-CM | POA: Diagnosis not present

## 2018-02-17 DIAGNOSIS — E7800 Pure hypercholesterolemia, unspecified: Secondary | ICD-10-CM | POA: Diagnosis not present

## 2018-02-18 ENCOUNTER — Other Ambulatory Visit: Payer: Self-pay | Admitting: Family Medicine

## 2018-02-18 DIAGNOSIS — Z125 Encounter for screening for malignant neoplasm of prostate: Secondary | ICD-10-CM | POA: Diagnosis not present

## 2018-02-18 DIAGNOSIS — M79662 Pain in left lower leg: Secondary | ICD-10-CM

## 2018-02-18 DIAGNOSIS — Z79899 Other long term (current) drug therapy: Secondary | ICD-10-CM | POA: Diagnosis not present

## 2018-02-18 DIAGNOSIS — R972 Elevated prostate specific antigen [PSA]: Secondary | ICD-10-CM | POA: Diagnosis not present

## 2018-02-18 DIAGNOSIS — Z86711 Personal history of pulmonary embolism: Secondary | ICD-10-CM | POA: Diagnosis not present

## 2018-02-18 DIAGNOSIS — E7800 Pure hypercholesterolemia, unspecified: Secondary | ICD-10-CM | POA: Diagnosis not present

## 2018-02-18 DIAGNOSIS — M79669 Pain in unspecified lower leg: Secondary | ICD-10-CM | POA: Diagnosis not present

## 2018-02-19 ENCOUNTER — Other Ambulatory Visit: Payer: Self-pay | Admitting: Family Medicine

## 2018-02-19 DIAGNOSIS — Z86711 Personal history of pulmonary embolism: Secondary | ICD-10-CM

## 2018-02-19 DIAGNOSIS — M79662 Pain in left lower leg: Secondary | ICD-10-CM

## 2018-02-25 ENCOUNTER — Ambulatory Visit
Admission: RE | Admit: 2018-02-25 | Discharge: 2018-02-25 | Disposition: A | Discharge status: Home / Self Care | Payer: Medicare Other | Source: Ambulatory Visit | Attending: Family Medicine | Admitting: Family Medicine

## 2018-02-25 DIAGNOSIS — Z86711 Personal history of pulmonary embolism: Secondary | ICD-10-CM

## 2018-02-25 DIAGNOSIS — M79662 Pain in left lower leg: Secondary | ICD-10-CM

## 2018-05-27 ENCOUNTER — Other Ambulatory Visit: Payer: Self-pay | Admitting: Physician Assistant

## 2018-05-27 DIAGNOSIS — D329 Benign neoplasm of meninges, unspecified: Secondary | ICD-10-CM

## 2018-06-08 ENCOUNTER — Ambulatory Visit
Admission: RE | Admit: 2018-06-08 | Discharge: 2018-06-08 | Disposition: A | Discharge status: Home / Self Care | Payer: Medicare Other | Source: Ambulatory Visit | Attending: Physician Assistant | Admitting: Physician Assistant

## 2018-06-08 DIAGNOSIS — D329 Benign neoplasm of meninges, unspecified: Secondary | ICD-10-CM | POA: Diagnosis not present

## 2018-06-08 MED ORDER — GADOBENATE DIMEGLUMINE 529 MG/ML IV SOLN
15.0000 mL | Freq: Once | INTRAVENOUS | Status: AC | PRN
  Administered 2018-06-08: 15 mL via INTRAVENOUS

## 2018-08-04 DIAGNOSIS — D329 Benign neoplasm of meninges, unspecified: Secondary | ICD-10-CM | POA: Diagnosis not present

## 2018-08-08 DIAGNOSIS — J029 Acute pharyngitis, unspecified: Secondary | ICD-10-CM | POA: Diagnosis not present

## 2018-08-19 DIAGNOSIS — Z86018 Personal history of other benign neoplasm: Secondary | ICD-10-CM | POA: Diagnosis not present

## 2018-08-19 DIAGNOSIS — L821 Other seborrheic keratosis: Secondary | ICD-10-CM | POA: Diagnosis not present

## 2018-08-19 DIAGNOSIS — L570 Actinic keratosis: Secondary | ICD-10-CM | POA: Diagnosis not present

## 2018-08-19 DIAGNOSIS — Z808 Family history of malignant neoplasm of other organs or systems: Secondary | ICD-10-CM | POA: Diagnosis not present

## 2018-08-19 DIAGNOSIS — Z23 Encounter for immunization: Secondary | ICD-10-CM | POA: Diagnosis not present

## 2018-08-19 DIAGNOSIS — D225 Melanocytic nevi of trunk: Secondary | ICD-10-CM | POA: Diagnosis not present

## 2018-09-11 DIAGNOSIS — R972 Elevated prostate specific antigen [PSA]: Secondary | ICD-10-CM | POA: Diagnosis not present

## 2018-09-11 DIAGNOSIS — J029 Acute pharyngitis, unspecified: Secondary | ICD-10-CM | POA: Diagnosis not present

## 2018-12-22 IMAGING — CT CT HEAD W/O CM
4 series · 15 of 47 positions shown, 17 images · non-contrast
Comparison: 05/02/2017 maxillofacial CT

CLINICAL DATA: Near syncopal episode after walking 2 is mailbox,
history of brain tumor excision

EXAM:
CT HEAD WITHOUT CONTRAST
TECHNIQUE: Contiguous axial images were obtained from the base of the skull
through the vertex without intravenous contrast.

[Series 3: head without · axial · non-contrast · 0.48mm/px · z∈[-102,+18]mm · 7 of 33 slices shown, 9 images]
[im 5/33  brain]
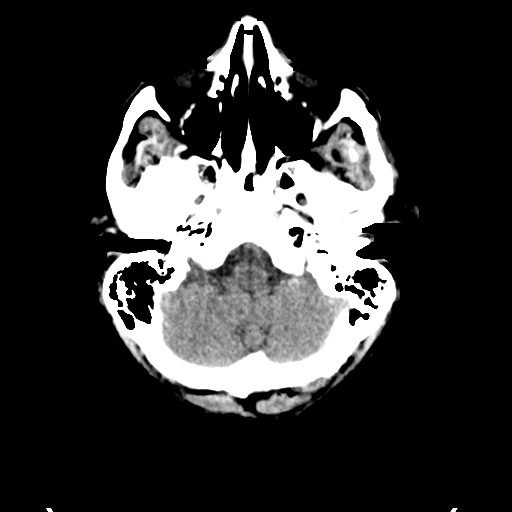
[im 5/33  bone]
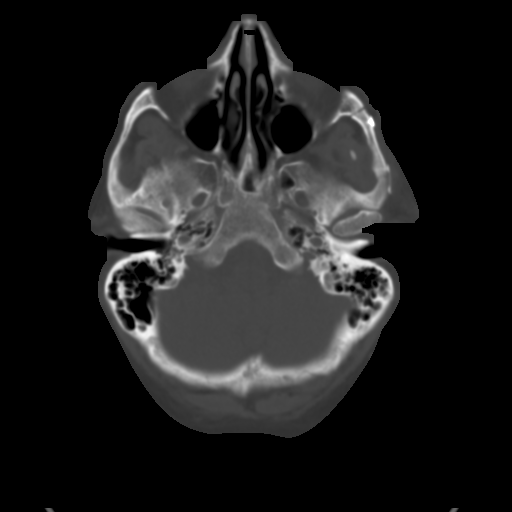
[im 9/33  brain]
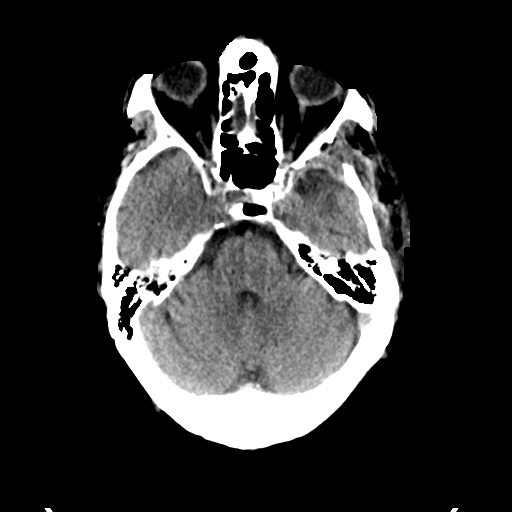
[im 13/33  brain]
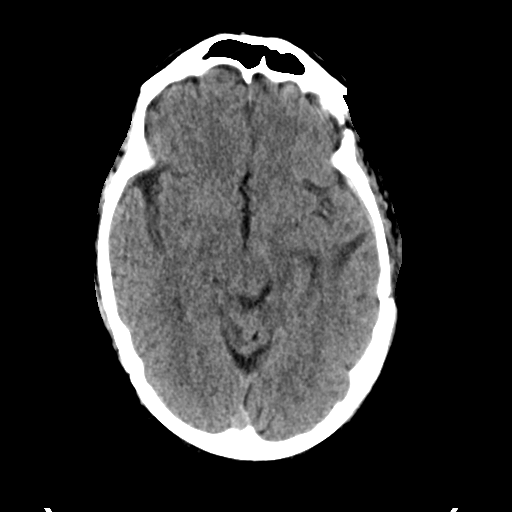
[im 17/33  brain]
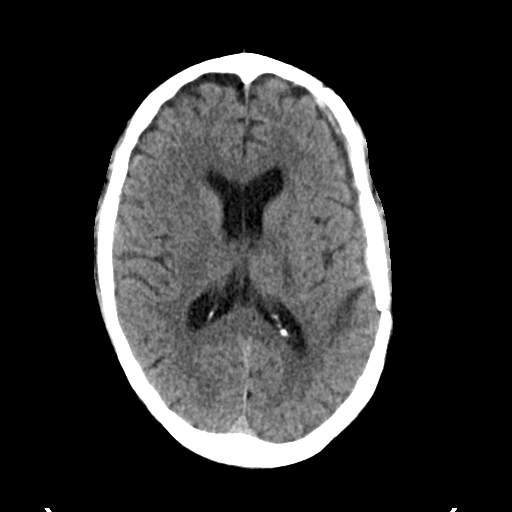
[im 21/33  brain]
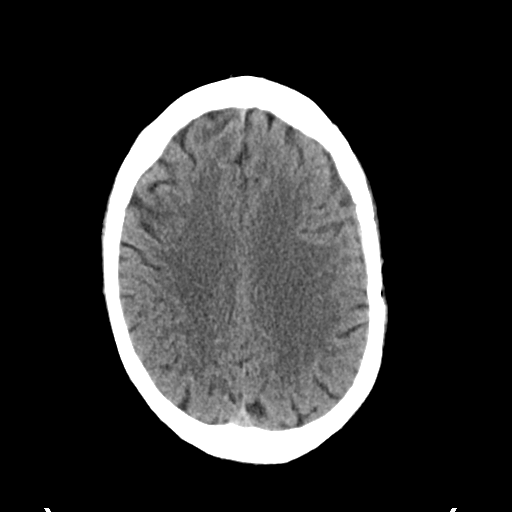
[im 21/33  bone]
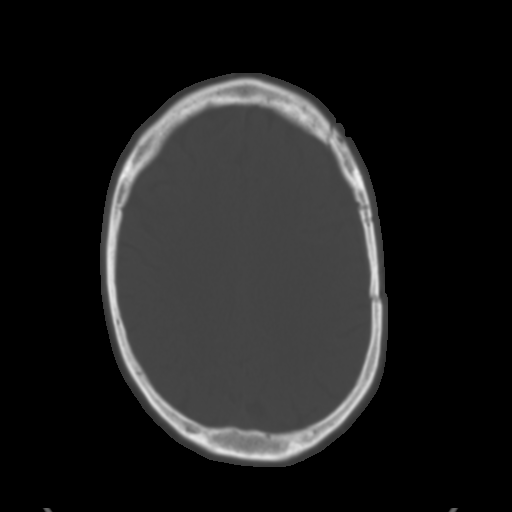
[im 25/33  brain]
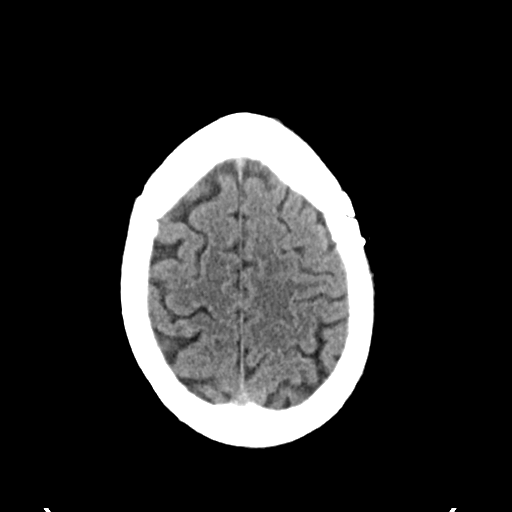
[im 29/33  brain]
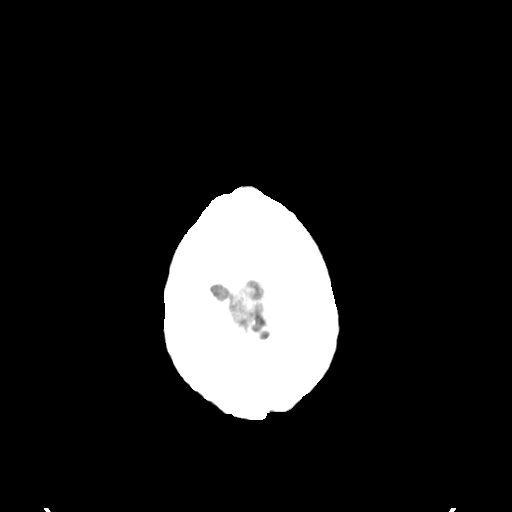

[Series 4: head bone · axial · 0.48mm/px · z∈[-106,-90]mm · 2 of 82 slices shown]
[im 9/82  bone]
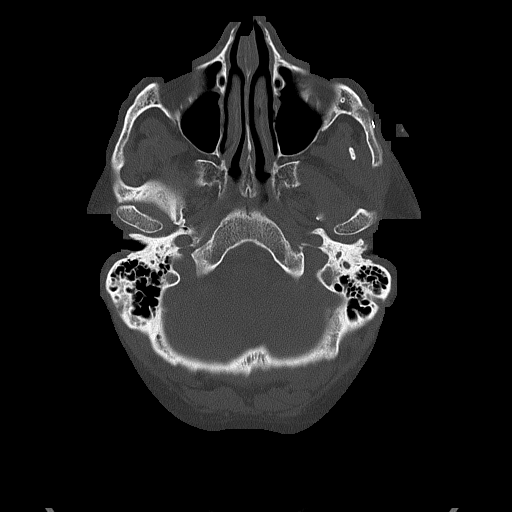
[im 17/82  bone]
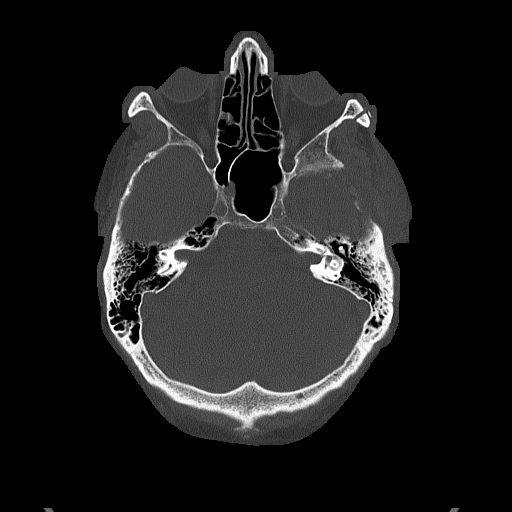

[Series 5: head without cor · coronal · non-contrast · 0.32mm/px · 3 of 73 slices shown]
[im 25/73  brain]
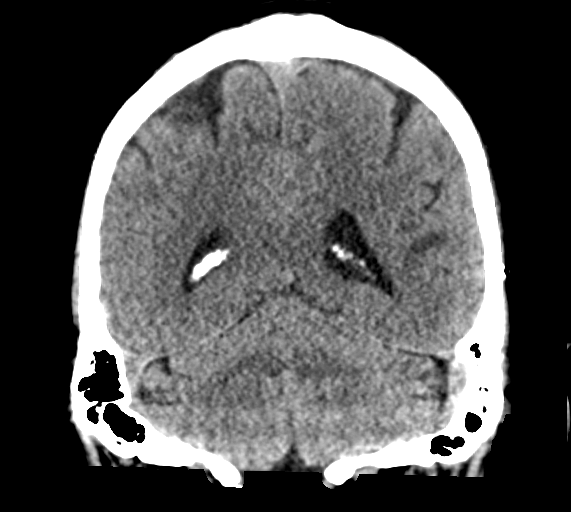
[im 33/73  brain]
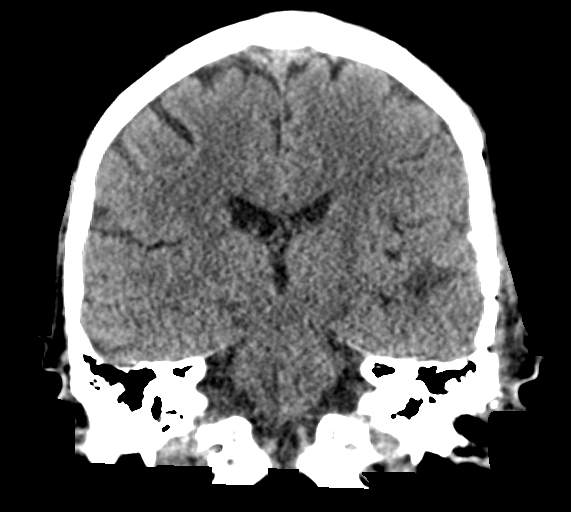
[im 41/73  brain]
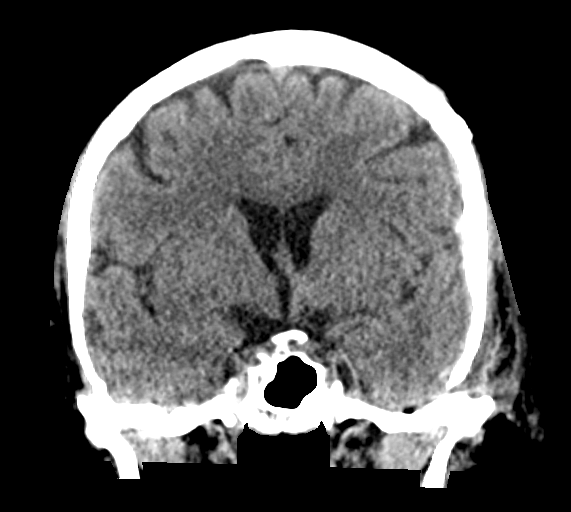

[Series 6: head without sag · sagittal · non-contrast · 0.32mm/px · 3 of 60 slices shown]
[im 20/60  brain]
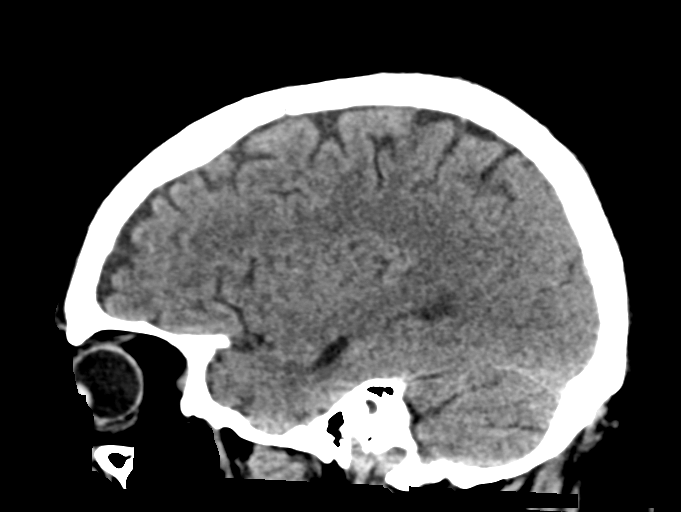
[im 30/60  brain]
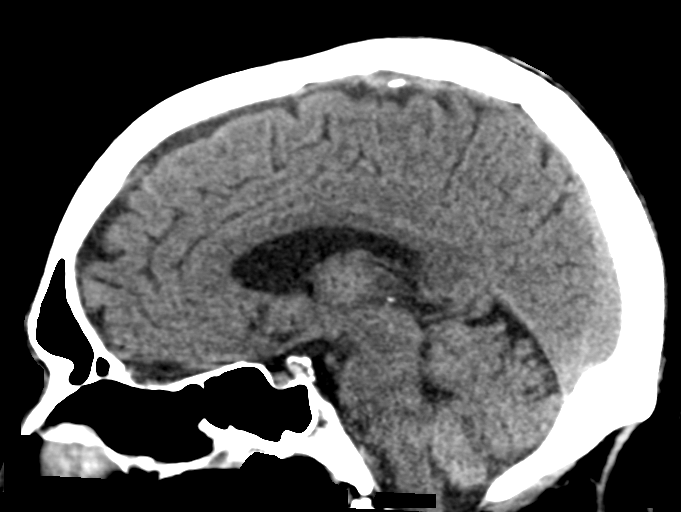
[im 40/60  brain]
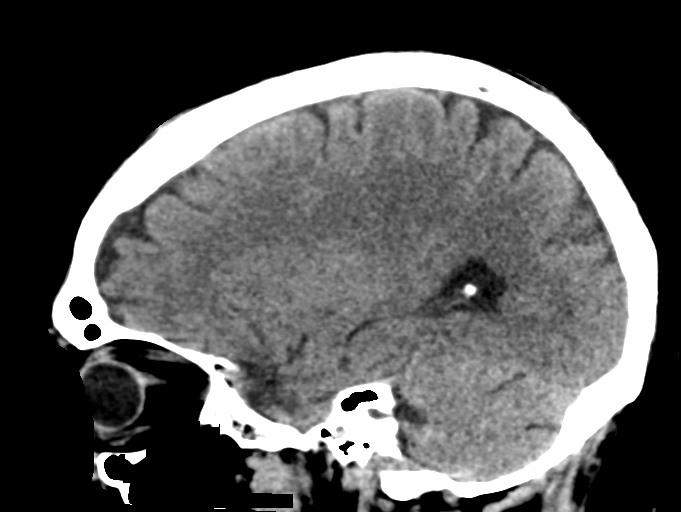

[15 of 47 positions shown; findings below may reference images not displayed]

FINDINGS: Brain: None

Vascular: Interval LEFT temporal craniotomy. Normal ventricular
morphology. No midline shift or mass effect. Edema within the LEFT
hemisphere seen on the previous exam has markedly decreased since
the prior study. Interval resection of large soft tissue mass at the
anterior aspect of LEFT temporal fossa, by interval MR [place] likely
meningioma. 3 mm thick LEFT frontal and temporal subdural density is
identified deep to the craniotomy site which may be related to
recent surgery. No additional acute subdural collection. Posterior
fossa unremarkable. No additional intracranial hemorrhage or
evidence of acute infarction.

Skull: LEFT temporoparietal craniotomy. Plate and screws at the LEFT
zygoma.

Sinuses/Orbits: Minimal fluid within a few a flocculus of the LEFT
mastoid air cells. Remaining paranasal sinuses clear.

Other: N/A
IMPRESSION: Interval LEFT temporoparietal craniotomy and resection of a large
mass at the anterior aspect of the LEFT middle cranial fossa.

Marked decrease in edema within the LEFT temporal and parietal lobes
with resolution of mass effect seen on previous exam.

Subdural high density 3 mm thick at the LEFT temporal and frontal
regions deep to the craniotomy, potentially related to prior
surgery.

No additional acute intracranial abnormalities identified.

Critical Value/emergent results were called by telephone at the time
of interpretation on 07/11/2017 at [DATE] to Dr. WELLINGTON LIMA MONTALVERNE ,
who verbally acknowledged these results.

## 2019-02-10 ENCOUNTER — Other Ambulatory Visit: Payer: Self-pay

## 2019-02-10 DIAGNOSIS — Z20822 Contact with and (suspected) exposure to covid-19: Secondary | ICD-10-CM

## 2019-02-10 DIAGNOSIS — R6889 Other general symptoms and signs: Secondary | ICD-10-CM | POA: Diagnosis not present

## 2019-02-11 LAB — NOVEL CORONAVIRUS, NAA: SARS-CoV-2, NAA: NOT DETECTED

## 2019-02-17 DIAGNOSIS — M9902 Segmental and somatic dysfunction of thoracic region: Secondary | ICD-10-CM | POA: Diagnosis not present

## 2019-02-17 DIAGNOSIS — M9903 Segmental and somatic dysfunction of lumbar region: Secondary | ICD-10-CM | POA: Diagnosis not present

## 2019-02-17 DIAGNOSIS — M62830 Muscle spasm of back: Secondary | ICD-10-CM | POA: Diagnosis not present

## 2019-02-17 DIAGNOSIS — M5441 Lumbago with sciatica, right side: Secondary | ICD-10-CM | POA: Diagnosis not present

## 2019-02-22 DIAGNOSIS — M9903 Segmental and somatic dysfunction of lumbar region: Secondary | ICD-10-CM | POA: Diagnosis not present

## 2019-02-22 DIAGNOSIS — M9902 Segmental and somatic dysfunction of thoracic region: Secondary | ICD-10-CM | POA: Diagnosis not present

## 2019-02-22 DIAGNOSIS — M62830 Muscle spasm of back: Secondary | ICD-10-CM | POA: Diagnosis not present

## 2019-02-22 DIAGNOSIS — M5441 Lumbago with sciatica, right side: Secondary | ICD-10-CM | POA: Diagnosis not present

## 2019-02-24 DIAGNOSIS — M9903 Segmental and somatic dysfunction of lumbar region: Secondary | ICD-10-CM | POA: Diagnosis not present

## 2019-02-24 DIAGNOSIS — M9902 Segmental and somatic dysfunction of thoracic region: Secondary | ICD-10-CM | POA: Diagnosis not present

## 2019-02-24 DIAGNOSIS — M62830 Muscle spasm of back: Secondary | ICD-10-CM | POA: Diagnosis not present

## 2019-02-24 DIAGNOSIS — M5441 Lumbago with sciatica, right side: Secondary | ICD-10-CM | POA: Diagnosis not present

## 2019-03-01 DIAGNOSIS — M9902 Segmental and somatic dysfunction of thoracic region: Secondary | ICD-10-CM | POA: Diagnosis not present

## 2019-03-01 DIAGNOSIS — M62830 Muscle spasm of back: Secondary | ICD-10-CM | POA: Diagnosis not present

## 2019-03-01 DIAGNOSIS — M9903 Segmental and somatic dysfunction of lumbar region: Secondary | ICD-10-CM | POA: Diagnosis not present

## 2019-03-01 DIAGNOSIS — M5441 Lumbago with sciatica, right side: Secondary | ICD-10-CM | POA: Diagnosis not present

## 2019-03-03 DIAGNOSIS — M5441 Lumbago with sciatica, right side: Secondary | ICD-10-CM | POA: Diagnosis not present

## 2019-03-03 DIAGNOSIS — M62830 Muscle spasm of back: Secondary | ICD-10-CM | POA: Diagnosis not present

## 2019-03-03 DIAGNOSIS — M9902 Segmental and somatic dysfunction of thoracic region: Secondary | ICD-10-CM | POA: Diagnosis not present

## 2019-03-03 DIAGNOSIS — M9903 Segmental and somatic dysfunction of lumbar region: Secondary | ICD-10-CM | POA: Diagnosis not present

## 2019-03-08 DIAGNOSIS — M9902 Segmental and somatic dysfunction of thoracic region: Secondary | ICD-10-CM | POA: Diagnosis not present

## 2019-03-08 DIAGNOSIS — M9903 Segmental and somatic dysfunction of lumbar region: Secondary | ICD-10-CM | POA: Diagnosis not present

## 2019-03-08 DIAGNOSIS — M5441 Lumbago with sciatica, right side: Secondary | ICD-10-CM | POA: Diagnosis not present

## 2019-03-08 DIAGNOSIS — M62830 Muscle spasm of back: Secondary | ICD-10-CM | POA: Diagnosis not present

## 2019-03-10 DIAGNOSIS — M5441 Lumbago with sciatica, right side: Secondary | ICD-10-CM | POA: Diagnosis not present

## 2019-03-10 DIAGNOSIS — M62830 Muscle spasm of back: Secondary | ICD-10-CM | POA: Diagnosis not present

## 2019-03-10 DIAGNOSIS — M9902 Segmental and somatic dysfunction of thoracic region: Secondary | ICD-10-CM | POA: Diagnosis not present

## 2019-03-10 DIAGNOSIS — M9903 Segmental and somatic dysfunction of lumbar region: Secondary | ICD-10-CM | POA: Diagnosis not present

## 2019-03-15 DIAGNOSIS — M5441 Lumbago with sciatica, right side: Secondary | ICD-10-CM | POA: Diagnosis not present

## 2019-03-15 DIAGNOSIS — M62830 Muscle spasm of back: Secondary | ICD-10-CM | POA: Diagnosis not present

## 2019-03-15 DIAGNOSIS — M9903 Segmental and somatic dysfunction of lumbar region: Secondary | ICD-10-CM | POA: Diagnosis not present

## 2019-03-15 DIAGNOSIS — M9902 Segmental and somatic dysfunction of thoracic region: Secondary | ICD-10-CM | POA: Diagnosis not present

## 2019-03-17 DIAGNOSIS — M5441 Lumbago with sciatica, right side: Secondary | ICD-10-CM | POA: Diagnosis not present

## 2019-03-17 DIAGNOSIS — M9902 Segmental and somatic dysfunction of thoracic region: Secondary | ICD-10-CM | POA: Diagnosis not present

## 2019-03-17 DIAGNOSIS — M62830 Muscle spasm of back: Secondary | ICD-10-CM | POA: Diagnosis not present

## 2019-03-17 DIAGNOSIS — M9903 Segmental and somatic dysfunction of lumbar region: Secondary | ICD-10-CM | POA: Diagnosis not present

## 2019-03-24 DIAGNOSIS — M9903 Segmental and somatic dysfunction of lumbar region: Secondary | ICD-10-CM | POA: Diagnosis not present

## 2019-03-24 DIAGNOSIS — Z0000 Encounter for general adult medical examination without abnormal findings: Secondary | ICD-10-CM | POA: Diagnosis not present

## 2019-03-24 DIAGNOSIS — Z79899 Other long term (current) drug therapy: Secondary | ICD-10-CM | POA: Diagnosis not present

## 2019-03-24 DIAGNOSIS — E7800 Pure hypercholesterolemia, unspecified: Secondary | ICD-10-CM | POA: Diagnosis not present

## 2019-03-24 DIAGNOSIS — M9902 Segmental and somatic dysfunction of thoracic region: Secondary | ICD-10-CM | POA: Diagnosis not present

## 2019-03-24 DIAGNOSIS — M5441 Lumbago with sciatica, right side: Secondary | ICD-10-CM | POA: Diagnosis not present

## 2019-03-24 DIAGNOSIS — R972 Elevated prostate specific antigen [PSA]: Secondary | ICD-10-CM | POA: Diagnosis not present

## 2019-03-24 DIAGNOSIS — M62830 Muscle spasm of back: Secondary | ICD-10-CM | POA: Diagnosis not present

## 2019-03-31 DIAGNOSIS — M9903 Segmental and somatic dysfunction of lumbar region: Secondary | ICD-10-CM | POA: Diagnosis not present

## 2019-03-31 DIAGNOSIS — M9902 Segmental and somatic dysfunction of thoracic region: Secondary | ICD-10-CM | POA: Diagnosis not present

## 2019-03-31 DIAGNOSIS — M62830 Muscle spasm of back: Secondary | ICD-10-CM | POA: Diagnosis not present

## 2019-03-31 DIAGNOSIS — M5441 Lumbago with sciatica, right side: Secondary | ICD-10-CM | POA: Diagnosis not present

## 2019-04-07 DIAGNOSIS — M62830 Muscle spasm of back: Secondary | ICD-10-CM | POA: Diagnosis not present

## 2019-04-07 DIAGNOSIS — M9903 Segmental and somatic dysfunction of lumbar region: Secondary | ICD-10-CM | POA: Diagnosis not present

## 2019-04-07 DIAGNOSIS — M5441 Lumbago with sciatica, right side: Secondary | ICD-10-CM | POA: Diagnosis not present

## 2019-04-07 DIAGNOSIS — M9902 Segmental and somatic dysfunction of thoracic region: Secondary | ICD-10-CM | POA: Diagnosis not present

## 2019-04-12 DIAGNOSIS — M9903 Segmental and somatic dysfunction of lumbar region: Secondary | ICD-10-CM | POA: Diagnosis not present

## 2019-04-12 DIAGNOSIS — Z79899 Other long term (current) drug therapy: Secondary | ICD-10-CM | POA: Diagnosis not present

## 2019-04-12 DIAGNOSIS — Z0000 Encounter for general adult medical examination without abnormal findings: Secondary | ICD-10-CM | POA: Diagnosis not present

## 2019-04-12 DIAGNOSIS — M9902 Segmental and somatic dysfunction of thoracic region: Secondary | ICD-10-CM | POA: Diagnosis not present

## 2019-04-12 DIAGNOSIS — M5441 Lumbago with sciatica, right side: Secondary | ICD-10-CM | POA: Diagnosis not present

## 2019-04-12 DIAGNOSIS — M62830 Muscle spasm of back: Secondary | ICD-10-CM | POA: Diagnosis not present

## 2019-04-12 DIAGNOSIS — R972 Elevated prostate specific antigen [PSA]: Secondary | ICD-10-CM | POA: Diagnosis not present

## 2019-04-12 DIAGNOSIS — E7800 Pure hypercholesterolemia, unspecified: Secondary | ICD-10-CM | POA: Diagnosis not present

## 2019-05-24 ENCOUNTER — Other Ambulatory Visit: Payer: Self-pay

## 2019-05-24 DIAGNOSIS — Z20822 Contact with and (suspected) exposure to covid-19: Secondary | ICD-10-CM

## 2019-05-25 LAB — NOVEL CORONAVIRUS, NAA: SARS-CoV-2, NAA: NOT DETECTED

## 2019-08-06 ENCOUNTER — Ambulatory Visit: Payer: Medicare Other | Attending: Internal Medicine

## 2019-08-06 DIAGNOSIS — Z23 Encounter for immunization: Secondary | ICD-10-CM | POA: Insufficient documentation

## 2019-08-06 NOTE — Progress Notes (Signed)
   Covid-19 Vaccination Clinic  Name:  Albert Moreno    MRN: [id number] DOB: Oct 31, 1950  08/06/2019  [name] was observed post Covid-19 immunization for 15 minutes without incidence. He was provided with Vaccine Information Sheet and instruction to access the V-Safe system.   [name] was instructed to call 911 with any severe reactions post vaccine: Marland Kitchen Difficulty breathing  . Swelling of your face and throat  . A fast heartbeat  . A bad rash all over your body  . Dizziness and weakness    Immunizations Administered    Name Date Dose VIS Date Route   Pfizer COVID-19 Vaccine 08/06/2019  8:23 AM 0.3 mL 06/25/2019 Intramuscular   Manufacturer: [place]   Lot: [id number]   [place]: [id number]

## 2019-08-27 ENCOUNTER — Ambulatory Visit: Payer: Medicare Other | Attending: Internal Medicine

## 2019-08-27 DIAGNOSIS — Z23 Encounter for immunization: Secondary | ICD-10-CM | POA: Insufficient documentation

## 2019-08-27 NOTE — Progress Notes (Signed)
   Covid-19 Vaccination Clinic  Name:  Albert Moreno    MRN: [id number] DOB: May 21, 1951  08/27/2019  Mr. Rosemann was observed post Covid-19 immunization for 15 minutes without incidence. He was provided with Vaccine Information Sheet and instruction to access the V-Safe system.   Mr. Muenchow was instructed to call 911 with any severe reactions post vaccine: Marland Kitchen Difficulty breathing  . Swelling of your face and throat  . A fast heartbeat  . A bad rash all over your body  . Dizziness and weakness    Immunizations Administered    Name Date Dose VIS Date Route   Pfizer COVID-19 Vaccine 08/27/2019  8:35 AM 0.3 mL 06/25/2019 Intramuscular   Manufacturer: [place]   Lot: [id number]   [place]: [id number]

## 2019-09-08 DIAGNOSIS — D485 Neoplasm of uncertain behavior of skin: Secondary | ICD-10-CM | POA: Diagnosis not present

## 2019-09-08 DIAGNOSIS — Z23 Encounter for immunization: Secondary | ICD-10-CM | POA: Diagnosis not present

## 2019-09-08 DIAGNOSIS — Z86018 Personal history of other benign neoplasm: Secondary | ICD-10-CM | POA: Diagnosis not present

## 2019-09-08 DIAGNOSIS — D225 Melanocytic nevi of trunk: Secondary | ICD-10-CM | POA: Diagnosis not present

## 2019-09-08 DIAGNOSIS — L821 Other seborrheic keratosis: Secondary | ICD-10-CM | POA: Diagnosis not present

## 2019-09-08 DIAGNOSIS — L570 Actinic keratosis: Secondary | ICD-10-CM | POA: Diagnosis not present

## 2019-10-22 DIAGNOSIS — E882 Lipomatosis, not elsewhere classified: Secondary | ICD-10-CM | POA: Diagnosis not present

## 2019-11-04 DIAGNOSIS — Z01818 Encounter for other preprocedural examination: Secondary | ICD-10-CM | POA: Diagnosis not present

## 2019-11-08 ENCOUNTER — Other Ambulatory Visit: Payer: Self-pay | Admitting: General Surgery

## 2019-11-08 DIAGNOSIS — D1722 Benign lipomatous neoplasm of skin and subcutaneous tissue of left arm: Secondary | ICD-10-CM | POA: Diagnosis not present

## 2019-11-08 DIAGNOSIS — D1724 Benign lipomatous neoplasm of skin and subcutaneous tissue of left leg: Secondary | ICD-10-CM | POA: Diagnosis not present

## 2019-11-08 DIAGNOSIS — D171 Benign lipomatous neoplasm of skin and subcutaneous tissue of trunk: Secondary | ICD-10-CM | POA: Diagnosis not present

## 2019-11-08 DIAGNOSIS — D1723 Benign lipomatous neoplasm of skin and subcutaneous tissue of right leg: Secondary | ICD-10-CM | POA: Diagnosis not present

## 2020-01-14 ENCOUNTER — Other Ambulatory Visit: Payer: Self-pay | Admitting: Physician Assistant

## 2020-01-14 DIAGNOSIS — D329 Benign neoplasm of meninges, unspecified: Secondary | ICD-10-CM

## 2020-02-28 DIAGNOSIS — B349 Viral infection, unspecified: Secondary | ICD-10-CM | POA: Diagnosis not present

## 2020-02-29 DIAGNOSIS — Z20822 Contact with and (suspected) exposure to covid-19: Secondary | ICD-10-CM | POA: Diagnosis not present

## 2020-02-29 DIAGNOSIS — B349 Viral infection, unspecified: Secondary | ICD-10-CM | POA: Diagnosis not present

## 2020-03-02 ENCOUNTER — Encounter: Payer: Self-pay | Admitting: Adult Health

## 2020-03-02 ENCOUNTER — Other Ambulatory Visit: Payer: Self-pay | Admitting: Adult Health

## 2020-03-02 DIAGNOSIS — U071 COVID-19: Secondary | ICD-10-CM

## 2020-03-02 NOTE — Progress Notes (Signed)
I connected by phone with Albert Moreno on 03/02/2020 at 5:40 PM to discuss the potential use of a new treatment for mild to moderate COVID-19 viral infection in non-hospitalized patients.  This patient is a 69 y.o. male that meets the FDA criteria for Emergency Use Authorization of COVID monoclonal antibody casirivimab/imdevimab.  Has a (+) direct SARS-CoV-2 viral test result  Has mild or moderate COVID-19   Is NOT hospitalized due to COVID-19  Is within 10 days of symptom onset  Has at least one of the high risk factor(s) for progression to severe COVID-19 and/or hospitalization as defined in EUA.  Specific high risk criteria : Older age (>/= 68 yo)   I have spoken and communicated the following to the patient or parent/caregiver regarding COVID monoclonal antibody treatment:  1. FDA has authorized the emergency use for the treatment of mild to moderate COVID-19 in adults and pediatric patients with positive results of direct SARS-CoV-2 viral testing who are 60 years of age and older weighing at least 40 kg, and who are at high risk for progressing to severe COVID-19 and/or hospitalization.  2. The significant known and potential risks and benefits of COVID monoclonal antibody, and the extent to which such potential risks and benefits are unknown.  3. Information on available alternative treatments and the risks and benefits of those alternatives, including clinical trials.  4. Patients treated with COVID monoclonal antibody should continue to self-isolate and use infection control measures (e.g., wear mask, isolate, social distance, avoid sharing personal items, clean and disinfect "high touch" surfaces, and frequent handwashing) according to CDC guidelines.   5. The patient or parent/caregiver has the option to accept or refuse COVID monoclonal antibody treatment.  After reviewing this information with the patient, The patient agreed to proceed with receiving  casirivimab\imdevimab infusion and will be provided a copy of the Fact sheet prior to receiving the infusion. Scot Dock 03/02/2020 5:40 PM

## 2020-03-03 ENCOUNTER — Ambulatory Visit (HOSPITAL_COMMUNITY)
Admission: RE | Admit: 2020-03-03 | Discharge: 2020-03-03 | Disposition: A | Discharge status: Home / Self Care | Payer: Medicare Other | Source: Ambulatory Visit | Attending: Pulmonary Disease | Admitting: Pulmonary Disease

## 2020-03-03 DIAGNOSIS — Z23 Encounter for immunization: Secondary | ICD-10-CM | POA: Insufficient documentation

## 2020-03-03 DIAGNOSIS — U071 COVID-19: Secondary | ICD-10-CM | POA: Insufficient documentation

## 2020-03-03 MED ORDER — METHYLPREDNISOLONE SODIUM SUCC 125 MG IJ SOLR: 125.0000 mg | Freq: Once | INTRAMUSCULAR | Status: DC | PRN

## 2020-03-03 MED ORDER — ALBUTEROL SULFATE HFA 108 (90 BASE) MCG/ACT IN AERS: 2.0000 | INHALATION_SPRAY | Freq: Once | RESPIRATORY_TRACT | Status: DC | PRN

## 2020-03-03 MED ORDER — EPINEPHRINE 0.3 MG/0.3ML IJ SOAJ: 0.3000 mg | Freq: Once | INTRAMUSCULAR | Status: DC | PRN

## 2020-03-03 MED ORDER — SODIUM CHLORIDE 0.9 % IV SOLN
1200.0000 mg | Freq: Once | INTRAVENOUS | Status: AC
  Administered 2020-03-03: 1200 mg via INTRAVENOUS
  Filled 2020-03-03: qty 10

## 2020-03-03 MED ORDER — DIPHENHYDRAMINE HCL 50 MG/ML IJ SOLN: 50.0000 mg | Freq: Once | INTRAMUSCULAR | Status: DC | PRN

## 2020-03-03 MED ORDER — SODIUM CHLORIDE 0.9 % IV SOLN: INTRAVENOUS | Status: DC | PRN

## 2020-03-03 MED ORDER — FAMOTIDINE IN NACL 20-0.9 MG/50ML-% IV SOLN: 20.0000 mg | Freq: Once | INTRAVENOUS | Status: DC | PRN

## 2020-03-03 NOTE — Discharge Instructions (Signed)

## 2020-03-03 NOTE — Progress Notes (Signed)
  Diagnosis: COVID-19  Physician:  Procedure: Covid Infusion Clinic Med: casirivimab\imdevimab infusion - Provided patient with casirivimab\imdevimab fact sheet for patients, parents and caregivers prior to infusion.  Complications: No immediate complications noted.  Discharge: Discharged home   [place] 03/03/2020

## 2020-03-10 DIAGNOSIS — Z8616 Personal history of COVID-19: Secondary | ICD-10-CM | POA: Diagnosis not present

## 2020-03-10 DIAGNOSIS — R05 Cough: Secondary | ICD-10-CM | POA: Diagnosis not present

## 2020-03-29 DIAGNOSIS — Z09 Encounter for follow-up examination after completed treatment for conditions other than malignant neoplasm: Secondary | ICD-10-CM | POA: Diagnosis not present

## 2020-03-29 DIAGNOSIS — E7800 Pure hypercholesterolemia, unspecified: Secondary | ICD-10-CM | POA: Diagnosis not present

## 2020-03-29 DIAGNOSIS — Z0000 Encounter for general adult medical examination without abnormal findings: Secondary | ICD-10-CM | POA: Diagnosis not present

## 2020-03-29 DIAGNOSIS — Z79899 Other long term (current) drug therapy: Secondary | ICD-10-CM | POA: Diagnosis not present

## 2020-03-29 DIAGNOSIS — Z7189 Other specified counseling: Secondary | ICD-10-CM | POA: Diagnosis not present

## 2020-03-29 DIAGNOSIS — R972 Elevated prostate specific antigen [PSA]: Secondary | ICD-10-CM | POA: Diagnosis not present

## 2020-03-29 DIAGNOSIS — R05 Cough: Secondary | ICD-10-CM | POA: Diagnosis not present

## 2020-06-14 ENCOUNTER — Ambulatory Visit
Admission: RE | Admit: 2020-06-14 | Discharge: 2020-06-14 | Disposition: A | Discharge status: Home / Self Care | Payer: Medicare Other | Source: Ambulatory Visit | Attending: Physician Assistant | Admitting: Physician Assistant

## 2020-06-14 DIAGNOSIS — D329 Benign neoplasm of meninges, unspecified: Secondary | ICD-10-CM | POA: Diagnosis not present

## 2020-06-14 DIAGNOSIS — J3489 Other specified disorders of nose and nasal sinuses: Secondary | ICD-10-CM | POA: Diagnosis not present

## 2020-06-14 DIAGNOSIS — G9389 Other specified disorders of brain: Secondary | ICD-10-CM | POA: Diagnosis not present

## 2020-06-14 MED ORDER — GADOBENATE DIMEGLUMINE 529 MG/ML IV SOLN
17.0000 mL | Freq: Once | INTRAVENOUS | Status: AC | PRN
  Administered 2020-06-14: 17 mL via INTRAVENOUS

## 2020-06-16 ENCOUNTER — Ambulatory Visit: Payer: Medicare Other | Attending: Internal Medicine

## 2020-06-16 DIAGNOSIS — Z23 Encounter for immunization: Secondary | ICD-10-CM

## 2020-06-16 NOTE — Progress Notes (Signed)
   Covid-19 Vaccination Clinic  Name:  BRAIAN TIJERINA    MRN: [id number] DOB: Mar 14, 1951  06/16/2020  Mr. Jawad was observed post Covid-19 immunization for 15 minutes without incident. He was provided with Vaccine Information Sheet and instruction to access the V-Safe system.   Mr. Dismore was instructed to call 911 with any severe reactions post vaccine: Marland Kitchen Difficulty breathing  . Swelling of face and throat  . A fast heartbeat  . A bad rash all over body  . Dizziness and weakness   Immunizations Administered    Name Date Dose VIS Date Route   Pfizer COVID-19 Vaccine 06/16/2020  2:56 PM 0.3 mL 05/03/2020 Intramuscular   Manufacturer: [place]   Lot: [id number]   NDC: [address]-3

## 2020-06-20 DIAGNOSIS — D329 Benign neoplasm of meninges, unspecified: Secondary | ICD-10-CM | POA: Diagnosis not present

## 2020-06-28 DIAGNOSIS — R972 Elevated prostate specific antigen [PSA]: Secondary | ICD-10-CM | POA: Diagnosis not present

## 2020-09-08 DIAGNOSIS — D0462 Carcinoma in situ of skin of left upper limb, including shoulder: Secondary | ICD-10-CM | POA: Diagnosis not present

## 2020-09-08 DIAGNOSIS — L578 Other skin changes due to chronic exposure to nonionizing radiation: Secondary | ICD-10-CM | POA: Diagnosis not present

## 2020-09-08 DIAGNOSIS — D1801 Hemangioma of skin and subcutaneous tissue: Secondary | ICD-10-CM | POA: Diagnosis not present

## 2020-09-08 DIAGNOSIS — L821 Other seborrheic keratosis: Secondary | ICD-10-CM | POA: Diagnosis not present

## 2020-09-08 DIAGNOSIS — Z86018 Personal history of other benign neoplasm: Secondary | ICD-10-CM | POA: Diagnosis not present

## 2020-09-08 DIAGNOSIS — D485 Neoplasm of uncertain behavior of skin: Secondary | ICD-10-CM | POA: Diagnosis not present

## 2020-09-08 DIAGNOSIS — L570 Actinic keratosis: Secondary | ICD-10-CM | POA: Diagnosis not present

## 2020-10-25 DIAGNOSIS — L570 Actinic keratosis: Secondary | ICD-10-CM | POA: Diagnosis not present

## 2020-10-25 DIAGNOSIS — D0462 Carcinoma in situ of skin of left upper limb, including shoulder: Secondary | ICD-10-CM | POA: Diagnosis not present

## 2021-01-22 DIAGNOSIS — D120 Benign neoplasm of cecum: Secondary | ICD-10-CM | POA: Diagnosis not present

## 2021-01-22 DIAGNOSIS — K5730 Diverticulosis of large intestine without perforation or abscess without bleeding: Secondary | ICD-10-CM | POA: Diagnosis not present

## 2021-01-22 DIAGNOSIS — Z86010 Personal history of colonic polyps: Secondary | ICD-10-CM | POA: Diagnosis not present

## 2021-01-24 DIAGNOSIS — D120 Benign neoplasm of cecum: Secondary | ICD-10-CM | POA: Diagnosis not present

## 2021-02-07 DIAGNOSIS — H2513 Age-related nuclear cataract, bilateral: Secondary | ICD-10-CM | POA: Diagnosis not present

## 2021-02-07 DIAGNOSIS — H2511 Age-related nuclear cataract, right eye: Secondary | ICD-10-CM | POA: Diagnosis not present

## 2021-02-07 DIAGNOSIS — H25043 Posterior subcapsular polar age-related cataract, bilateral: Secondary | ICD-10-CM | POA: Diagnosis not present

## 2021-02-07 DIAGNOSIS — H43811 Vitreous degeneration, right eye: Secondary | ICD-10-CM | POA: Diagnosis not present

## 2021-02-07 DIAGNOSIS — H18413 Arcus senilis, bilateral: Secondary | ICD-10-CM | POA: Diagnosis not present

## 2021-04-04 DIAGNOSIS — E7800 Pure hypercholesterolemia, unspecified: Secondary | ICD-10-CM | POA: Diagnosis not present

## 2021-04-04 DIAGNOSIS — Z79899 Other long term (current) drug therapy: Secondary | ICD-10-CM | POA: Diagnosis not present

## 2021-04-04 DIAGNOSIS — R972 Elevated prostate specific antigen [PSA]: Secondary | ICD-10-CM | POA: Diagnosis not present

## 2021-04-11 DIAGNOSIS — Z0000 Encounter for general adult medical examination without abnormal findings: Secondary | ICD-10-CM | POA: Diagnosis not present

## 2021-05-01 DIAGNOSIS — H25042 Posterior subcapsular polar age-related cataract, left eye: Secondary | ICD-10-CM | POA: Diagnosis not present

## 2021-05-01 DIAGNOSIS — H2511 Age-related nuclear cataract, right eye: Secondary | ICD-10-CM | POA: Diagnosis not present

## 2021-05-01 DIAGNOSIS — H2512 Age-related nuclear cataract, left eye: Secondary | ICD-10-CM | POA: Diagnosis not present

## 2021-05-16 DIAGNOSIS — Z125 Encounter for screening for malignant neoplasm of prostate: Secondary | ICD-10-CM | POA: Diagnosis not present

## 2021-05-16 DIAGNOSIS — R058 Other specified cough: Secondary | ICD-10-CM | POA: Diagnosis not present

## 2021-05-16 DIAGNOSIS — E7800 Pure hypercholesterolemia, unspecified: Secondary | ICD-10-CM | POA: Diagnosis not present

## 2021-05-16 DIAGNOSIS — Z79899 Other long term (current) drug therapy: Secondary | ICD-10-CM | POA: Diagnosis not present

## 2021-07-31 DIAGNOSIS — U071 COVID-19: Secondary | ICD-10-CM | POA: Diagnosis not present

## 2021-08-20 DIAGNOSIS — Z961 Presence of intraocular lens: Secondary | ICD-10-CM | POA: Diagnosis not present

## 2021-08-20 DIAGNOSIS — H25042 Posterior subcapsular polar age-related cataract, left eye: Secondary | ICD-10-CM | POA: Diagnosis not present

## 2021-08-20 DIAGNOSIS — H43811 Vitreous degeneration, right eye: Secondary | ICD-10-CM | POA: Diagnosis not present

## 2021-08-20 DIAGNOSIS — H2512 Age-related nuclear cataract, left eye: Secondary | ICD-10-CM | POA: Diagnosis not present

## 2021-09-18 DIAGNOSIS — J40 Bronchitis, not specified as acute or chronic: Secondary | ICD-10-CM | POA: Diagnosis not present

## 2021-09-19 ENCOUNTER — Ambulatory Visit
Admission: RE | Admit: 2021-09-19 | Discharge: 2021-09-19 | Disposition: A | Discharge status: Home / Self Care | Payer: Medicare Other | Source: Ambulatory Visit | Attending: Family Medicine | Admitting: Family Medicine

## 2021-09-19 ENCOUNTER — Other Ambulatory Visit: Payer: Self-pay | Admitting: Family Medicine

## 2021-09-19 DIAGNOSIS — R059 Cough, unspecified: Secondary | ICD-10-CM | POA: Diagnosis not present

## 2021-09-19 DIAGNOSIS — Z8616 Personal history of COVID-19: Secondary | ICD-10-CM

## 2021-09-19 DIAGNOSIS — R053 Chronic cough: Secondary | ICD-10-CM

## 2021-10-15 DIAGNOSIS — J40 Bronchitis, not specified as acute or chronic: Secondary | ICD-10-CM | POA: Diagnosis not present

## 2021-10-24 DIAGNOSIS — L821 Other seborrheic keratosis: Secondary | ICD-10-CM | POA: Diagnosis not present

## 2021-10-24 DIAGNOSIS — L578 Other skin changes due to chronic exposure to nonionizing radiation: Secondary | ICD-10-CM | POA: Diagnosis not present

## 2021-10-24 DIAGNOSIS — L570 Actinic keratosis: Secondary | ICD-10-CM | POA: Diagnosis not present

## 2021-10-24 DIAGNOSIS — Z86018 Personal history of other benign neoplasm: Secondary | ICD-10-CM | POA: Diagnosis not present

## 2021-10-24 DIAGNOSIS — Z86007 Personal history of in-situ neoplasm of skin: Secondary | ICD-10-CM | POA: Diagnosis not present

## 2021-10-24 DIAGNOSIS — D229 Melanocytic nevi, unspecified: Secondary | ICD-10-CM | POA: Diagnosis not present

## 2021-10-24 DIAGNOSIS — D1801 Hemangioma of skin and subcutaneous tissue: Secondary | ICD-10-CM | POA: Diagnosis not present

## 2022-03-04 DIAGNOSIS — D1720 Benign lipomatous neoplasm of skin and subcutaneous tissue of unspecified limb: Secondary | ICD-10-CM | POA: Diagnosis not present

## 2022-04-22 DIAGNOSIS — R972 Elevated prostate specific antigen [PSA]: Secondary | ICD-10-CM | POA: Diagnosis not present

## 2022-04-22 DIAGNOSIS — I77819 Aortic ectasia, unspecified site: Secondary | ICD-10-CM | POA: Diagnosis not present

## 2022-04-22 DIAGNOSIS — Z6828 Body mass index (BMI) 28.0-28.9, adult: Secondary | ICD-10-CM | POA: Diagnosis not present

## 2022-04-22 DIAGNOSIS — Z0000 Encounter for general adult medical examination without abnormal findings: Secondary | ICD-10-CM | POA: Diagnosis not present

## 2022-04-22 DIAGNOSIS — Z79899 Other long term (current) drug therapy: Secondary | ICD-10-CM | POA: Diagnosis not present

## 2022-04-22 DIAGNOSIS — E7800 Pure hypercholesterolemia, unspecified: Secondary | ICD-10-CM | POA: Diagnosis not present

## 2022-04-23 ENCOUNTER — Other Ambulatory Visit: Payer: Self-pay | Admitting: Family Medicine

## 2022-04-23 DIAGNOSIS — I77819 Aortic ectasia, unspecified site: Secondary | ICD-10-CM

## 2022-05-03 ENCOUNTER — Ambulatory Visit
Admission: RE | Admit: 2022-05-03 | Discharge: 2022-05-03 | Disposition: A | Discharge status: Home / Self Care | Payer: Medicare Other | Source: Ambulatory Visit | Attending: Family Medicine | Admitting: Family Medicine

## 2022-05-03 DIAGNOSIS — I708 Atherosclerosis of other arteries: Secondary | ICD-10-CM | POA: Diagnosis not present

## 2022-05-03 DIAGNOSIS — I77819 Aortic ectasia, unspecified site: Secondary | ICD-10-CM

## 2022-06-17 ENCOUNTER — Other Ambulatory Visit: Payer: Self-pay | Admitting: General Surgery

## 2022-06-17 DIAGNOSIS — D1721 Benign lipomatous neoplasm of skin and subcutaneous tissue of right arm: Secondary | ICD-10-CM | POA: Diagnosis not present

## 2022-06-17 DIAGNOSIS — D1722 Benign lipomatous neoplasm of skin and subcutaneous tissue of left arm: Secondary | ICD-10-CM | POA: Diagnosis not present

## 2022-07-19 ENCOUNTER — Other Ambulatory Visit: Payer: Self-pay | Admitting: Physician Assistant

## 2022-07-19 DIAGNOSIS — D329 Benign neoplasm of meninges, unspecified: Secondary | ICD-10-CM

## 2022-10-25 DIAGNOSIS — C44511 Basal cell carcinoma of skin of breast: Secondary | ICD-10-CM | POA: Diagnosis not present

## 2022-10-25 DIAGNOSIS — L578 Other skin changes due to chronic exposure to nonionizing radiation: Secondary | ICD-10-CM | POA: Diagnosis not present

## 2022-10-25 DIAGNOSIS — L570 Actinic keratosis: Secondary | ICD-10-CM | POA: Diagnosis not present

## 2022-10-25 DIAGNOSIS — D485 Neoplasm of uncertain behavior of skin: Secondary | ICD-10-CM | POA: Diagnosis not present

## 2022-10-25 DIAGNOSIS — D225 Melanocytic nevi of trunk: Secondary | ICD-10-CM | POA: Diagnosis not present

## 2022-10-25 DIAGNOSIS — D1801 Hemangioma of skin and subcutaneous tissue: Secondary | ICD-10-CM | POA: Diagnosis not present

## 2022-10-25 DIAGNOSIS — Z86018 Personal history of other benign neoplasm: Secondary | ICD-10-CM | POA: Diagnosis not present

## 2022-10-25 DIAGNOSIS — L821 Other seborrheic keratosis: Secondary | ICD-10-CM | POA: Diagnosis not present

## 2022-10-25 DIAGNOSIS — Z808 Family history of malignant neoplasm of other organs or systems: Secondary | ICD-10-CM | POA: Diagnosis not present

## 2022-10-25 DIAGNOSIS — D0461 Carcinoma in situ of skin of right upper limb, including shoulder: Secondary | ICD-10-CM | POA: Diagnosis not present

## 2022-10-25 DIAGNOSIS — L820 Inflamed seborrheic keratosis: Secondary | ICD-10-CM | POA: Diagnosis not present

## 2022-11-19 DIAGNOSIS — C44622 Squamous cell carcinoma of skin of right upper limb, including shoulder: Secondary | ICD-10-CM | POA: Diagnosis not present

## 2022-11-19 DIAGNOSIS — D0461 Carcinoma in situ of skin of right upper limb, including shoulder: Secondary | ICD-10-CM | POA: Diagnosis not present

## 2023-01-08 DIAGNOSIS — C44511 Basal cell carcinoma of skin of breast: Secondary | ICD-10-CM | POA: Diagnosis not present

## 2023-01-08 DIAGNOSIS — C44519 Basal cell carcinoma of skin of other part of trunk: Secondary | ICD-10-CM | POA: Diagnosis not present

## 2023-01-31 DIAGNOSIS — M5441 Lumbago with sciatica, right side: Secondary | ICD-10-CM | POA: Diagnosis not present

## 2023-01-31 DIAGNOSIS — M9903 Segmental and somatic dysfunction of lumbar region: Secondary | ICD-10-CM | POA: Diagnosis not present

## 2023-02-04 DIAGNOSIS — M5441 Lumbago with sciatica, right side: Secondary | ICD-10-CM | POA: Diagnosis not present

## 2023-02-04 DIAGNOSIS — M9903 Segmental and somatic dysfunction of lumbar region: Secondary | ICD-10-CM | POA: Diagnosis not present

## 2023-02-06 DIAGNOSIS — M9903 Segmental and somatic dysfunction of lumbar region: Secondary | ICD-10-CM | POA: Diagnosis not present

## 2023-02-06 DIAGNOSIS — M5441 Lumbago with sciatica, right side: Secondary | ICD-10-CM | POA: Diagnosis not present

## 2023-02-11 DIAGNOSIS — M5441 Lumbago with sciatica, right side: Secondary | ICD-10-CM | POA: Diagnosis not present

## 2023-02-11 DIAGNOSIS — M9903 Segmental and somatic dysfunction of lumbar region: Secondary | ICD-10-CM | POA: Diagnosis not present

## 2023-02-13 DIAGNOSIS — M9903 Segmental and somatic dysfunction of lumbar region: Secondary | ICD-10-CM | POA: Diagnosis not present

## 2023-02-13 DIAGNOSIS — M5441 Lumbago with sciatica, right side: Secondary | ICD-10-CM | POA: Diagnosis not present

## 2023-02-19 DIAGNOSIS — M5441 Lumbago with sciatica, right side: Secondary | ICD-10-CM | POA: Diagnosis not present

## 2023-02-19 DIAGNOSIS — M9903 Segmental and somatic dysfunction of lumbar region: Secondary | ICD-10-CM | POA: Diagnosis not present

## 2023-02-27 DIAGNOSIS — M5441 Lumbago with sciatica, right side: Secondary | ICD-10-CM | POA: Diagnosis not present

## 2023-02-27 DIAGNOSIS — M9903 Segmental and somatic dysfunction of lumbar region: Secondary | ICD-10-CM | POA: Diagnosis not present

## 2023-03-04 DIAGNOSIS — M9903 Segmental and somatic dysfunction of lumbar region: Secondary | ICD-10-CM | POA: Diagnosis not present

## 2023-03-04 DIAGNOSIS — M5441 Lumbago with sciatica, right side: Secondary | ICD-10-CM | POA: Diagnosis not present

## 2023-03-18 DIAGNOSIS — M9903 Segmental and somatic dysfunction of lumbar region: Secondary | ICD-10-CM | POA: Diagnosis not present

## 2023-03-18 DIAGNOSIS — M5441 Lumbago with sciatica, right side: Secondary | ICD-10-CM | POA: Diagnosis not present

## 2023-04-01 DIAGNOSIS — M9903 Segmental and somatic dysfunction of lumbar region: Secondary | ICD-10-CM | POA: Diagnosis not present

## 2023-04-01 DIAGNOSIS — M5441 Lumbago with sciatica, right side: Secondary | ICD-10-CM | POA: Diagnosis not present

## 2023-04-15 DIAGNOSIS — M9903 Segmental and somatic dysfunction of lumbar region: Secondary | ICD-10-CM | POA: Diagnosis not present

## 2023-04-15 DIAGNOSIS — M5441 Lumbago with sciatica, right side: Secondary | ICD-10-CM | POA: Diagnosis not present

## 2023-04-29 DIAGNOSIS — M5441 Lumbago with sciatica, right side: Secondary | ICD-10-CM | POA: Diagnosis not present

## 2023-04-29 DIAGNOSIS — M9903 Segmental and somatic dysfunction of lumbar region: Secondary | ICD-10-CM | POA: Diagnosis not present

## 2023-05-02 DIAGNOSIS — Z6829 Body mass index (BMI) 29.0-29.9, adult: Secondary | ICD-10-CM | POA: Diagnosis not present

## 2023-05-02 DIAGNOSIS — E7800 Pure hypercholesterolemia, unspecified: Secondary | ICD-10-CM | POA: Diagnosis not present

## 2023-05-02 DIAGNOSIS — R059 Cough, unspecified: Secondary | ICD-10-CM | POA: Diagnosis not present

## 2023-05-02 DIAGNOSIS — R972 Elevated prostate specific antigen [PSA]: Secondary | ICD-10-CM | POA: Diagnosis not present

## 2023-05-02 DIAGNOSIS — Z79899 Other long term (current) drug therapy: Secondary | ICD-10-CM | POA: Diagnosis not present

## 2023-05-13 DIAGNOSIS — M9903 Segmental and somatic dysfunction of lumbar region: Secondary | ICD-10-CM | POA: Diagnosis not present

## 2023-05-13 DIAGNOSIS — M5441 Lumbago with sciatica, right side: Secondary | ICD-10-CM | POA: Diagnosis not present

## 2023-05-19 ENCOUNTER — Ambulatory Visit
Admission: RE | Admit: 2023-05-19 | Discharge: 2023-05-19 | Disposition: A | Discharge status: Home / Self Care | Payer: Medicare Other | Source: Ambulatory Visit | Attending: Physician Assistant | Admitting: Physician Assistant

## 2023-05-19 DIAGNOSIS — D329 Benign neoplasm of meninges, unspecified: Secondary | ICD-10-CM | POA: Diagnosis not present

## 2023-05-19 MED ORDER — GADOPICLENOL 0.5 MMOL/ML IV SOLN
7.5000 mL | Freq: Once | INTRAVENOUS | Status: AC | PRN
  Administered 2023-05-19: 7.5 mL via INTRAVENOUS

## 2023-05-21 DIAGNOSIS — D1801 Hemangioma of skin and subcutaneous tissue: Secondary | ICD-10-CM | POA: Diagnosis not present

## 2023-05-21 DIAGNOSIS — L578 Other skin changes due to chronic exposure to nonionizing radiation: Secondary | ICD-10-CM | POA: Diagnosis not present

## 2023-05-21 DIAGNOSIS — D485 Neoplasm of uncertain behavior of skin: Secondary | ICD-10-CM | POA: Diagnosis not present

## 2023-05-21 DIAGNOSIS — Z808 Family history of malignant neoplasm of other organs or systems: Secondary | ICD-10-CM | POA: Diagnosis not present

## 2023-05-21 DIAGNOSIS — D225 Melanocytic nevi of trunk: Secondary | ICD-10-CM | POA: Diagnosis not present

## 2023-05-21 DIAGNOSIS — Z86018 Personal history of other benign neoplasm: Secondary | ICD-10-CM | POA: Diagnosis not present

## 2023-05-21 DIAGNOSIS — L570 Actinic keratosis: Secondary | ICD-10-CM | POA: Diagnosis not present

## 2023-05-21 DIAGNOSIS — L821 Other seborrheic keratosis: Secondary | ICD-10-CM | POA: Diagnosis not present

## 2023-06-03 DIAGNOSIS — M9903 Segmental and somatic dysfunction of lumbar region: Secondary | ICD-10-CM | POA: Diagnosis not present

## 2023-06-03 DIAGNOSIS — M5441 Lumbago with sciatica, right side: Secondary | ICD-10-CM | POA: Diagnosis not present

## 2023-06-17 DIAGNOSIS — D329 Benign neoplasm of meninges, unspecified: Secondary | ICD-10-CM | POA: Diagnosis not present

## 2023-08-12 DIAGNOSIS — D489 Neoplasm of uncertain behavior, unspecified: Secondary | ICD-10-CM | POA: Diagnosis not present

## 2023-08-12 DIAGNOSIS — D485 Neoplasm of uncertain behavior of skin: Secondary | ICD-10-CM | POA: Diagnosis not present

## 2023-11-26 DIAGNOSIS — L821 Other seborrheic keratosis: Secondary | ICD-10-CM | POA: Diagnosis not present

## 2023-11-26 DIAGNOSIS — D225 Melanocytic nevi of trunk: Secondary | ICD-10-CM | POA: Diagnosis not present

## 2023-11-26 DIAGNOSIS — L570 Actinic keratosis: Secondary | ICD-10-CM | POA: Diagnosis not present

## 2023-11-26 DIAGNOSIS — Z86018 Personal history of other benign neoplasm: Secondary | ICD-10-CM | POA: Diagnosis not present

## 2023-11-26 DIAGNOSIS — D485 Neoplasm of uncertain behavior of skin: Secondary | ICD-10-CM | POA: Diagnosis not present

## 2023-11-26 DIAGNOSIS — D1801 Hemangioma of skin and subcutaneous tissue: Secondary | ICD-10-CM | POA: Diagnosis not present

## 2023-11-26 DIAGNOSIS — Z808 Family history of malignant neoplasm of other organs or systems: Secondary | ICD-10-CM | POA: Diagnosis not present

## 2023-11-26 DIAGNOSIS — L578 Other skin changes due to chronic exposure to nonionizing radiation: Secondary | ICD-10-CM | POA: Diagnosis not present

## 2024-02-05 DIAGNOSIS — Z09 Encounter for follow-up examination after completed treatment for conditions other than malignant neoplasm: Secondary | ICD-10-CM | POA: Diagnosis not present

## 2024-02-05 DIAGNOSIS — D123 Benign neoplasm of transverse colon: Secondary | ICD-10-CM | POA: Diagnosis not present

## 2024-02-05 DIAGNOSIS — Z860101 Personal history of adenomatous and serrated colon polyps: Secondary | ICD-10-CM | POA: Diagnosis not present

## 2024-02-05 DIAGNOSIS — K5730 Diverticulosis of large intestine without perforation or abscess without bleeding: Secondary | ICD-10-CM | POA: Diagnosis not present

## 2024-02-11 DIAGNOSIS — D123 Benign neoplasm of transverse colon: Secondary | ICD-10-CM | POA: Diagnosis not present

## 2024-02-12 DIAGNOSIS — D485 Neoplasm of uncertain behavior of skin: Secondary | ICD-10-CM | POA: Diagnosis not present

## 2024-02-12 DIAGNOSIS — D225 Melanocytic nevi of trunk: Secondary | ICD-10-CM | POA: Diagnosis not present

## 2024-04-29 DIAGNOSIS — E7800 Pure hypercholesterolemia, unspecified: Secondary | ICD-10-CM | POA: Diagnosis not present

## 2024-04-29 DIAGNOSIS — Z6828 Body mass index (BMI) 28.0-28.9, adult: Secondary | ICD-10-CM | POA: Diagnosis not present

## 2024-04-29 DIAGNOSIS — R059 Cough, unspecified: Secondary | ICD-10-CM | POA: Diagnosis not present

## 2024-04-29 DIAGNOSIS — Z79899 Other long term (current) drug therapy: Secondary | ICD-10-CM | POA: Diagnosis not present

## 2024-04-29 DIAGNOSIS — Z125 Encounter for screening for malignant neoplasm of prostate: Secondary | ICD-10-CM | POA: Diagnosis not present

## 2024-05-27 DIAGNOSIS — L578 Other skin changes due to chronic exposure to nonionizing radiation: Secondary | ICD-10-CM | POA: Diagnosis not present

## 2024-05-27 DIAGNOSIS — D225 Melanocytic nevi of trunk: Secondary | ICD-10-CM | POA: Diagnosis not present

## 2024-05-27 DIAGNOSIS — L821 Other seborrheic keratosis: Secondary | ICD-10-CM | POA: Diagnosis not present

## 2024-05-27 DIAGNOSIS — Z85828 Personal history of other malignant neoplasm of skin: Secondary | ICD-10-CM | POA: Diagnosis not present

## 2024-05-27 DIAGNOSIS — D1801 Hemangioma of skin and subcutaneous tissue: Secondary | ICD-10-CM | POA: Diagnosis not present

## 2024-05-27 DIAGNOSIS — L570 Actinic keratosis: Secondary | ICD-10-CM | POA: Diagnosis not present

## 2024-05-27 DIAGNOSIS — Z86018 Personal history of other benign neoplasm: Secondary | ICD-10-CM | POA: Diagnosis not present

## 2024-05-27 DIAGNOSIS — Z808 Family history of malignant neoplasm of other organs or systems: Secondary | ICD-10-CM | POA: Diagnosis not present
